# Patient Record
Sex: Female | Born: 1965 | Race: Black or African American | Hispanic: No | Marital: Married | Smoking: Never smoker
Health system: Southern US, Community
[De-identification: ages and names within clinical notes are randomized; demographics above are authoritative.]

## PROBLEM LIST (undated history)

## (undated) DIAGNOSIS — Z87442 Personal history of urinary calculi: Secondary | ICD-10-CM

## (undated) DIAGNOSIS — E785 Hyperlipidemia, unspecified: Secondary | ICD-10-CM

## (undated) DIAGNOSIS — E119 Type 2 diabetes mellitus without complications: Secondary | ICD-10-CM

## (undated) DIAGNOSIS — R519 Headache, unspecified: Secondary | ICD-10-CM

## (undated) DIAGNOSIS — D649 Anemia, unspecified: Secondary | ICD-10-CM

## (undated) DIAGNOSIS — I10 Essential (primary) hypertension: Secondary | ICD-10-CM

## (undated) DIAGNOSIS — N200 Calculus of kidney: Secondary | ICD-10-CM

## (undated) HISTORY — DX: Calculus of kidney: N20.0

## (undated) HISTORY — DX: Hyperlipidemia, unspecified: E78.5

## (undated) HISTORY — DX: Type 2 diabetes mellitus without complications: E11.9

## (undated) HISTORY — DX: Essential (primary) hypertension: I10

## (undated) HISTORY — PX: TONSILLECTOMY: SUR1361

---

## 2016-07-28 HISTORY — PX: ABDOMINAL HYSTERECTOMY: SHX81

## 2017-04-27 DIAGNOSIS — N200 Calculus of kidney: Secondary | ICD-10-CM

## 2017-04-27 HISTORY — DX: Calculus of kidney: N20.0

## 2017-05-22 ENCOUNTER — Other Ambulatory Visit: Payer: Self-pay | Admitting: Internal Medicine

## 2017-05-22 ENCOUNTER — Encounter: Payer: Self-pay | Admitting: Internal Medicine

## 2017-05-22 ENCOUNTER — Ambulatory Visit (INDEPENDENT_AMBULATORY_CARE_PROVIDER_SITE_OTHER): Payer: PRIVATE HEALTH INSURANCE | Admitting: Internal Medicine

## 2017-05-22 VITALS — BP 142/88 | HR 78 | Resp 16 | Ht 61.0 in | Wt 153.0 lb

## 2017-05-22 DIAGNOSIS — I10 Essential (primary) hypertension: Secondary | ICD-10-CM | POA: Insufficient documentation

## 2017-05-22 DIAGNOSIS — M778 Other enthesopathies, not elsewhere classified: Secondary | ICD-10-CM

## 2017-05-22 DIAGNOSIS — E118 Type 2 diabetes mellitus with unspecified complications: Secondary | ICD-10-CM | POA: Insufficient documentation

## 2017-05-22 DIAGNOSIS — E782 Mixed hyperlipidemia: Secondary | ICD-10-CM

## 2017-05-22 DIAGNOSIS — E119 Type 2 diabetes mellitus without complications: Secondary | ICD-10-CM

## 2017-05-22 DIAGNOSIS — N200 Calculus of kidney: Secondary | ICD-10-CM | POA: Diagnosis not present

## 2017-05-22 DIAGNOSIS — E785 Hyperlipidemia, unspecified: Secondary | ICD-10-CM | POA: Insufficient documentation

## 2017-05-22 DIAGNOSIS — E1169 Type 2 diabetes mellitus with other specified complication: Secondary | ICD-10-CM | POA: Insufficient documentation

## 2017-05-22 DIAGNOSIS — Z1239 Encounter for other screening for malignant neoplasm of breast: Secondary | ICD-10-CM

## 2017-05-22 DIAGNOSIS — Z1231 Encounter for screening mammogram for malignant neoplasm of breast: Secondary | ICD-10-CM

## 2017-05-22 LAB — POCT URINALYSIS DIPSTICK
Bilirubin, UA: NEGATIVE
Glucose, UA: NEGATIVE
KETONES UA: NEGATIVE
LEUKOCYTES UA: NEGATIVE
NITRITE UA: NEGATIVE
PH UA: 6 (ref 5.0–8.0)
PROTEIN UA: NEGATIVE
RBC UA: NEGATIVE
Spec Grav, UA: <=1.005 — AB (ref 1.010–1.025)
Urobilinogen, UA: 0.2 E.U./dL

## 2017-05-22 MED ORDER — LISINOPRIL 5 MG PO TABS: 5.0000 mg | ORAL_TABLET | Freq: Every day | ORAL | 3 refills | Status: DC

## 2017-05-22 MED ORDER — SIMVASTATIN 10 MG PO TABS: 10.0000 mg | ORAL_TABLET | Freq: Every day | ORAL | 3 refills | Status: DC

## 2017-05-22 MED ORDER — GLUCOSE BLOOD VI STRP: ORAL_STRIP | 12 refills | Status: DC

## 2017-05-22 NOTE — Patient Instructions (Addendum)
Ibuprofen 400 mg three times a day and Ice pack for 15 minutes at the end of the day  For DM - I will likely add Jardiance -  Samples of Synjardy is Jardiance plus metformin.  You would substitute one synjardy for your morning metformin dose as a trial.  Schedule Mammogram - call the number on the card  S

## 2017-05-22 NOTE — Progress Notes (Signed)
Date:  05/22/2017   Name:  Teresa Rivas   DOB:  12/19/1965   MRN:  [id number]   Chief Complaint: Establish Care ([place]Juel Aurora there. ); Diabetes (Nausea from BS over 200 for last 1-2 weeks. A1C November 2017 was 10.6 and she was increased to 1000 bid of Metformin and that is last changes done. Eating more than she did before moving here to [place]  2 weeks ago. ); Elbow Injury (Right arm- Pain when using her arm to push or pull or squeeze. Denies injury. Was on and off last few months worsening over past 2 weeks. ); and Nephrolithiasis ([place] here  2 wks ago and was told she has multiple kidney stones and she passed one but needs REF to Urology )  Diabetes  She presents for her follow-up diabetic visit. She has type 2 diabetes mellitus. Her disease course has been worsening (last A1C very high). Pertinent negatives for hypoglycemia include no headaches or tremors. Pertinent negatives for diabetes include no chest pain, no fatigue, no foot paresthesias, no polydipsia, no polyuria and no weight loss. Symptoms are stable. Risk factors for coronary artery disease include dyslipidemia and hypertension. Current diabetic treatment includes oral agent (monotherapy) (metformin 1000 mg bid). Her weight is stable. She is following a generally unhealthy diet. An ACE inhibitor/angiotensin II receptor blocker is being taken. Eye exam is current (reported by patient).  Arm Pain   The incident occurred more than 1 week ago. There was no injury mechanism. The pain is present in the right elbow. The quality of the pain is described as burning. The pain is mild. Pertinent negatives include no chest pain or numbness. The symptoms are aggravated by movement and lifting. She has tried NSAIDs for the symptoms. The treatment provided mild relief.  Renal stones - new onset one month ago before moving here.  Seen in ER and had stones on right  side.  She passed a stone and has had no more pain.  She was supposed to be referred to a Urologist.   Review of Systems  Constitutional: Negative for appetite change, fatigue, fever, unexpected weight change and weight loss.  HENT: Negative for tinnitus and trouble swallowing.   Eyes: Negative for visual disturbance.  Respiratory: Negative for cough, chest tightness and shortness of breath.   Cardiovascular: Negative for chest pain, palpitations and leg swelling.  Gastrointestinal: Negative for abdominal pain.  Endocrine: Negative for polydipsia and polyuria.  Genitourinary: Negative for dysuria and hematuria.  Musculoskeletal: Positive for arthralgias. Negative for gait problem. Neck pain: right elbow.  Skin: Negative for rash.  Neurological: Negative for tremors, numbness and headaches.  Hematological: Negative for adenopathy.  Psychiatric/Behavioral: Negative for dysphoric mood and sleep disturbance.    Patient Active Problem List   Diagnosis Date Noted  . Type 2 diabetes mellitus without complication, without long-term current use of insulin (HCC) 05/22/2017  . Mixed hyperlipidemia 05/22/2017  . Essential hypertension 05/22/2017  . Renal stones 05/22/2017    Prior to Admission medications   Medication Sig Start Date End Date Taking? Authorizing Provider  lisinopril (PRINIVIL,ZESTRIL) 5 MG tablet Take 5 mg by mouth daily.   Yes [provider]  metFORMIN (GLUCOPHAGE) 1000 MG tablet Take 1,000 mg by mouth 2 (two) times daily with a meal.   Yes [provider]  simvastatin (ZOCOR) 10 MG tablet Take 10 mg by mouth daily.   Yes [provider]    Allergies  Allergen Reactions  . Codeine     Past Surgical History:  Procedure Laterality Date  . ABDOMINAL HYSTERECTOMY  07/28/2016   total due to fibroids  . TONSILLECTOMY      Social History  Substance Use Topics  . Smoking status: Never Smoker  . Smokeless tobacco: Never Used  . Alcohol use No       Medication list has been reviewed and updated.   Physical Exam  Constitutional: She is oriented to person, place, and time. She appears well-developed. No distress.  HENT:  Head: Normocephalic and atraumatic.  Neck: Normal range of motion. Neck supple. Carotid bruit is not present. No thyromegaly present.  Cardiovascular: Normal rate, regular rhythm and normal heart sounds.   Pulmonary/Chest: Effort normal and breath sounds normal. No respiratory distress. She has no wheezes.  Abdominal: There is no CVA tenderness.  Musculoskeletal: Normal range of motion.       Right elbow: She exhibits no swelling. Tenderness found. Lateral epicondyle tenderness noted.  Neurological: She is alert and oriented to person, place, and time. She has normal reflexes. No sensory deficit.  Skin: Skin is warm and dry. No rash noted.  Psychiatric: She has a normal mood and affect. Her speech is normal and behavior is normal. Thought content normal.  Nursing note and vitals reviewed.   BP (!) 142/88   Pulse 78   Resp 16   Ht 5\' 1"  (1.549 m)   Wt 153 lb (69.4 kg)   LMP  (LMP Unknown)   SpO2 100%   BMI 28.91 kg/m   Assessment and Plan: 1. Type 2 diabetes mellitus without complication, without long-term current use of insulin (HCC) Will likely add Jardiance - sample of synjardy 25/1000 given to sub for AM metformin - Comprehensive metabolic panel - Hemoglobin A1c - TSH - Microalbumin / creatinine urine ratio - glucose blood (ACCU-CHEK AVIVA) test strip; Use as instructed  Dispense: 50 each; Refill: 12  2. Mixed hyperlipidemia Continue statin therapy - Lipid panel - simvastatin (ZOCOR) 10 MG tablet; Take 1 tablet (10 mg total) by mouth daily.  Dispense: 60 tablet; Refill: 3  3. Essential hypertension Resume ACE - CBC with Differential/Platelet - lisinopril (PRINIVIL,ZESTRIL) 5 MG tablet; Take 1 tablet (5 mg total) by mouth daily.  Dispense: 60 tablet; Refill: 3  4. Renal stones refer -  POCT urinalysis dipstick  5. Tendonitis of elbow, right Use Advil and ice  6. Breast cancer screening - MM DIGITAL SCREENING BILATERAL; Future   Meds ordered this encounter  Medications  . glucose blood (ACCU-CHEK AVIVA) test strip    Sig: Use as instructed    Dispense:  50 each    Refill:  12  . lisinopril (PRINIVIL,ZESTRIL) 5 MG tablet    Sig: Take 1 tablet (5 mg total) by mouth daily.    Dispense:  60 tablet    Refill:  3  . simvastatin (ZOCOR) 10 MG tablet    Sig: Take 1 tablet (10 mg total) by mouth daily.    Dispense:  60 tablet    Refill:  3    Bari Edward, MD [facility]  05/22/2017

## 2017-05-23 LAB — CBC WITH DIFFERENTIAL/PLATELET
Basophils Absolute: 0 10*3/uL (ref 0.0–0.2)
Basos: 0 %
EOS (ABSOLUTE): 0.1 10*3/uL (ref 0.0–0.4)
EOS: 1 %
Hematocrit: 39.7 % (ref 34.0–46.6)
Hemoglobin: 13.1 g/dL (ref 11.1–15.9)
IMMATURE GRANS (ABS): 0 10*3/uL (ref 0.0–0.1)
IMMATURE GRANULOCYTES: 0 %
LYMPHS: 36 %
Lymphocytes Absolute: 2.9 10*3/uL (ref 0.7–3.1)
MCH: 26.3 pg — ABNORMAL LOW (ref 26.6–33.0)
MCHC: 33 g/dL (ref 31.5–35.7)
MCV: 80 fL (ref 79–97)
MONOS ABS: 0.4 10*3/uL (ref 0.1–0.9)
Monocytes: 5 %
NEUTROS PCT: 58 %
Neutrophils Absolute: 4.8 10*3/uL (ref 1.4–7.0)
PLATELETS: 271 10*3/uL (ref 150–379)
RBC: 4.98 x10E6/uL (ref 3.77–5.28)
RDW: 13.6 % (ref 12.3–15.4)
WBC: 8.3 10*3/uL (ref 3.4–10.8)

## 2017-05-23 LAB — COMPREHENSIVE METABOLIC PANEL
ALT: 19 IU/L (ref 0–32)
AST: 17 IU/L (ref 0–40)
Albumin/Globulin Ratio: 1.9 (ref 1.2–2.2)
Albumin: 4.8 g/dL (ref 3.5–5.5)
Alkaline Phosphatase: 114 IU/L (ref 39–117)
BUN/Creatinine Ratio: 16 (ref 9–23)
BUN: 13 mg/dL (ref 6–24)
Bilirubin Total: 0.3 mg/dL (ref 0.0–1.2)
CALCIUM: 10.8 mg/dL — AB (ref 8.7–10.2)
CO2: 24 mmol/L (ref 20–29)
Chloride: 97 mmol/L (ref 96–106)
Creatinine, Ser: 0.81 mg/dL (ref 0.57–1.00)
GFR calc Af Amer: 97 mL/min/{1.73_m2} (ref >59)
GFR, EST NON AFRICAN AMERICAN: 84 mL/min/{1.73_m2} (ref >59)
Globulin, Total: 2.5 g/dL (ref 1.5–4.5)
Glucose: 192 mg/dL — ABNORMAL HIGH (ref 65–99)
Potassium: 4.4 mmol/L (ref 3.5–5.2)
Sodium: 137 mmol/L (ref 134–144)
Total Protein: 7.3 g/dL (ref 6.0–8.5)

## 2017-05-23 LAB — LIPID PANEL
CHOLESTEROL TOTAL: 161 mg/dL (ref 100–199)
Chol/HDL Ratio: 2.6 ratio (ref 0.0–4.4)
HDL: 61 mg/dL (ref >39)
LDL Calculated: 88 mg/dL (ref 0–99)
TRIGLYCERIDES: 60 mg/dL (ref 0–149)
VLDL CHOLESTEROL CAL: 12 mg/dL (ref 5–40)

## 2017-05-23 LAB — TSH: TSH: 2.12 u[IU]/mL (ref 0.450–4.500)

## 2017-05-23 LAB — HEMOGLOBIN A1C
ESTIMATED AVERAGE GLUCOSE: 263 mg/dL
Hgb A1c MFr Bld: 10.8 % — ABNORMAL HIGH (ref 4.8–5.6)

## 2017-05-23 LAB — MICROALBUMIN / CREATININE URINE RATIO
Creatinine, Urine: 13.2 mg/dL
Microalbumin, Urine: <3 ug/mL

## 2017-06-05 ENCOUNTER — Encounter: Payer: Self-pay | Admitting: Internal Medicine

## 2017-06-05 NOTE — Telephone Encounter (Signed)
Please advice- patient mychart msg.

## 2017-06-14 ENCOUNTER — Other Ambulatory Visit: Payer: Self-pay | Admitting: Internal Medicine

## 2017-06-14 ENCOUNTER — Encounter: Payer: Self-pay | Admitting: Internal Medicine

## 2017-06-14 MED ORDER — METFORMIN HCL 1000 MG PO TABS: 1000.0000 mg | ORAL_TABLET | Freq: Every evening | ORAL | 5 refills | Status: DC

## 2017-06-14 MED ORDER — EMPAGLIFLOZIN-METFORMIN HCL ER 25-1000 MG PO TB24: 1.0000 | ORAL_TABLET | Freq: Every morning | ORAL | 5 refills | Status: DC

## 2017-06-14 NOTE — Telephone Encounter (Signed)
Meds

## 2017-06-18 NOTE — Telephone Encounter (Signed)
Pt cannot afford medication. Wants to stick to metformin. Please Advise.

## 2017-06-25 ENCOUNTER — Encounter: Payer: Self-pay | Admitting: Internal Medicine

## 2017-06-25 ENCOUNTER — Ambulatory Visit
Admission: RE | Admit: 2017-06-25 | Discharge: 2017-06-25 | Disposition: A | Discharge status: Home / Self Care | Payer: No Typology Code available for payment source | Source: Ambulatory Visit | Attending: Internal Medicine | Admitting: Internal Medicine

## 2017-06-25 DIAGNOSIS — Z1231 Encounter for screening mammogram for malignant neoplasm of breast: Secondary | ICD-10-CM | POA: Insufficient documentation

## 2017-06-25 DIAGNOSIS — Z1239 Encounter for other screening for malignant neoplasm of breast: Secondary | ICD-10-CM

## 2017-06-26 ENCOUNTER — Other Ambulatory Visit: Payer: Self-pay

## 2017-06-26 DIAGNOSIS — M778 Other enthesopathies, not elsewhere classified: Secondary | ICD-10-CM

## 2017-07-05 ENCOUNTER — Inpatient Hospital Stay
Admission: RE | Admit: 2017-07-05 | Discharge: 2017-07-05 | Disposition: A | Discharge status: Home / Self Care | Payer: Self-pay | Source: Ambulatory Visit | Attending: *Deleted | Admitting: *Deleted

## 2017-07-05 ENCOUNTER — Other Ambulatory Visit: Payer: Self-pay | Admitting: *Deleted

## 2017-07-05 DIAGNOSIS — Z9289 Personal history of other medical treatment: Secondary | ICD-10-CM

## 2017-09-26 ENCOUNTER — Ambulatory Visit (INDEPENDENT_AMBULATORY_CARE_PROVIDER_SITE_OTHER): Payer: PRIVATE HEALTH INSURANCE | Admitting: Internal Medicine

## 2017-09-26 ENCOUNTER — Encounter: Payer: Self-pay | Admitting: Internal Medicine

## 2017-09-26 VITALS — BP 136/80 | HR 74 | Ht 61.0 in | Wt 151.0 lb

## 2017-09-26 DIAGNOSIS — E782 Mixed hyperlipidemia: Secondary | ICD-10-CM

## 2017-09-26 DIAGNOSIS — I10 Essential (primary) hypertension: Secondary | ICD-10-CM

## 2017-09-26 DIAGNOSIS — E119 Type 2 diabetes mellitus without complications: Secondary | ICD-10-CM

## 2017-09-26 DIAGNOSIS — M7711 Lateral epicondylitis, right elbow: Secondary | ICD-10-CM

## 2017-09-26 MED ORDER — IBUPROFEN 800 MG PO TABS: 800.0000 mg | ORAL_TABLET | Freq: Three times a day (TID) | ORAL | 0 refills | Status: DC | PRN

## 2017-09-26 MED ORDER — GLIMEPIRIDE 2 MG PO TABS: 2.0000 mg | ORAL_TABLET | Freq: Every day | ORAL | 3 refills | Status: DC

## 2017-09-26 NOTE — Progress Notes (Signed)
Date:  09/26/2017   Name:  Teresa ShockShanti Rivas   DOB:  [birth year]   MRN:  [id number]   Chief Complaint: Diabetes (Still having issues with BS being elevated. ); Hyperlipidemia; and Hypertension She was unable to afford synjardy - the cost was $150 per month so she went back to taking metformin bid.  She does not check her BS very often.  Diabetes  She presents for her follow-up diabetic visit. She has type 2 diabetes mellitus. Her disease course has been improving. Associated symptoms include polyuria. Pertinent negatives for diabetes include no chest pain, no fatigue and no polydipsia. Symptoms are stable (could not afford jardiance). Current diabetic treatment includes oral agent (monotherapy). She is compliant with treatment all of the time. There is no change in her home blood glucose trend. An ACE inhibitor/angiotensin II receptor blocker is being taken. Eye exam is current.  Hyperlipidemia  This is a chronic problem. Pertinent negatives include no chest pain or shortness of breath. Current antihyperlipidemic treatment includes statins. The current treatment provides significant improvement of lipids.  Hypertension  This is a chronic problem. The problem is controlled. Pertinent negatives include no chest pain, palpitations or shortness of breath. Past treatments include ACE inhibitors. The current treatment provides significant improvement.   Lab Results  Component Value Date   HGBA1C 10.8 (H) 05/22/2017   Lab Results  Component Value Date   CREATININE 0.81 05/22/2017   BUN 13 05/22/2017   NA 137 05/22/2017   K 4.4 05/22/2017   CL 97 05/22/2017   CO2 24 05/22/2017   Lab Results  Component Value Date   CHOL 161 05/22/2017   HDL 61 05/22/2017   LDLCALC 88 05/22/2017   TRIG 60 05/22/2017   CHOLHDL 2.6 05/22/2017      Review of Systems  Constitutional: Negative for chills, fatigue, fever and unexpected weight change.  Eyes: Negative for visual disturbance.  Respiratory:  Negative for chest tightness, shortness of breath and wheezing.   Cardiovascular: Negative for chest pain and palpitations.  Gastrointestinal: Negative for abdominal pain.  Endocrine: Positive for polyuria. Negative for polydipsia.  Musculoskeletal: Positive for arthralgias (right elbow is hurting again).  Skin: Negative for rash.  Allergic/Immunologic: Negative for environmental allergies.  Psychiatric/Behavioral: Negative for sleep disturbance.    Patient Active Problem List   Diagnosis Date Noted  . 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 Type 2 diabetes mellitus without complication, without long-term current use of insulin (HCC) 05/22/2017  . Mixed hyperlipidemia 05/22/2017  . Essential hypertension 05/22/2017  . Renal stones 05/22/2017    Prior to Admission medications   Medication Sig Start Date End Date Taking? Authorizing Provider       Reubin MilanBerglund, Jevante Hollibaugh H, MD  glucose blood (ACCU-CHEK AVIVA) test strip Use as instructed 05/22/17   Reubin MilanBerglund, Cincere Deprey H, MD  lisinopril (PRINIVIL,ZESTRIL) 5 MG tablet Take 1 tablet (5 mg total) by mouth daily. 05/22/17   Reubin MilanBerglund, Shamir Tuzzolino H, MD  metFORMIN (GLUCOPHAGE) 1000 MG tablet Take 1 tablet (1,000 mg total) by mouth every evening. 06/14/17   Reubin MilanBerglund, Deejay Koppelman H, MD  simvastatin (ZOCOR) 10 MG tablet Take 1 tablet (10 mg total) by mouth daily. 05/22/17   Reubin MilanBerglund, Joyel Chenette H, MD    Allergies  Allergen Reactions  . Codeine     Past Surgical History:  Procedure Laterality Date  . ABDOMINAL HYSTERECTOMY  07/28/2016   total due to fibroids  . TONSILLECTOMY      Social History  Substance Use Topics  . Smoking  status: Never Smoker  . Smokeless tobacco: Never Used  . Alcohol use No     Medication list has been reviewed and updated.  PHQ 2/9 Scores 09/26/2017  PHQ - 2 Score 0    Physical Exam  Constitutional: She is oriented to person, place, and time. She appears well-developed. No distress.  HENT:  Head: Normocephalic and atraumatic.    Neck: Normal range of motion. Neck supple.  Cardiovascular: Normal rate, regular rhythm and normal heart sounds.   Pulmonary/Chest: Effort normal and breath sounds normal. No respiratory distress. She has no wheezes.  Musculoskeletal: Normal range of motion. She exhibits tenderness. She exhibits no edema.       Right elbow: She exhibits no swelling and no effusion. Tenderness found. Lateral epicondyle tenderness noted.  Neurological: She is alert and oriented to person, place, and time.  Skin: Skin is warm and dry. No rash noted.  Psychiatric: She has a normal mood and affect. Her behavior is normal. Thought content normal.  Nursing note and vitals reviewed.   BP 136/80   Pulse 74   Ht 5\' 1"  (1.549 m)   Wt 151 lb (68.5 kg)   LMP  (LMP Unknown)   SpO2 100%   BMI 28.53 kg/m   Assessment and Plan: 1. Type 2 diabetes mellitus without complication, without long-term current use of insulin (HCC) Continue metformin; add amaryl - glimepiride (AMARYL) 2 MG tablet; Take 1 tablet (2 mg total) by mouth daily before breakfast.  Dispense: 30 tablet; Refill: 3 - Hemoglobin A1c  2. Essential hypertension Continue current medications  3. Mixed hyperlipidemia On statin therapy  4. Epicondylitis, lateral, right Continue rubs, massage and ibuprofen Return to Ortho if needed - ibuprofen (ADVIL,MOTRIN) 800 MG tablet; Take 1 tablet (800 mg total) by mouth every 8 (eight) hours as needed.  Dispense: 90 tablet; Refill: 0   Meds ordered this encounter  Medications  . glimepiride (AMARYL) 2 MG tablet    Sig: Take 1 tablet (2 mg total) by mouth daily before breakfast.    Dispense:  30 tablet    Refill:  3  . ibuprofen (ADVIL,MOTRIN) 800 MG tablet    Sig: Take 1 tablet (800 mg total) by mouth every 8 (eight) hours as needed.    Dispense:  90 tablet    Refill:  0    Partially dictated using Animal nutritionist. Any errors are unintentional.  Bari Edward, MD [facility]  09/26/2017

## 2017-10-17 NOTE — Progress Notes (Signed)
Called patient and left VMs- unable to reach pt but informed needs to get labs done.

## 2017-12-18 ENCOUNTER — Other Ambulatory Visit: Payer: Self-pay | Admitting: Internal Medicine

## 2018-01-25 ENCOUNTER — Ambulatory Visit: Payer: PRIVATE HEALTH INSURANCE | Admitting: Internal Medicine

## 2018-02-18 ENCOUNTER — Ambulatory Visit (INDEPENDENT_AMBULATORY_CARE_PROVIDER_SITE_OTHER): Payer: BLUE CROSS/BLUE SHIELD | Admitting: Podiatry

## 2018-02-18 ENCOUNTER — Encounter: Payer: Self-pay | Admitting: Podiatry

## 2018-02-18 VITALS — BP 145/80 | HR 78

## 2018-02-18 DIAGNOSIS — M2010 Hallux valgus (acquired), unspecified foot: Secondary | ICD-10-CM

## 2018-02-18 DIAGNOSIS — L84 Corns and callosities: Secondary | ICD-10-CM

## 2018-02-18 DIAGNOSIS — E119 Type 2 diabetes mellitus without complications: Secondary | ICD-10-CM | POA: Diagnosis not present

## 2018-02-18 DIAGNOSIS — M2142 Flat foot [pes planus] (acquired), left foot: Secondary | ICD-10-CM

## 2018-02-18 DIAGNOSIS — M2141 Flat foot [pes planus] (acquired), right foot: Secondary | ICD-10-CM | POA: Diagnosis not present

## 2018-02-18 NOTE — Progress Notes (Signed)
This patient presents the office with chief complaint of painful callus on the  bottom of both feet at the heel area.  She says she has soaked her feet in an effort to remove the callus and she experiences pain when she is been walking.  There is a dramatic  line of demarcation between callus tissue on both feet and normal skin.  There is no exubernace of callus noted.  She presents to the office for evaluation of feet.  Patient is diabetic taking medicine. She presents the office for an evaluation and treatment  General Appearance  Alert, conversant and in no acute stress.  Vascular  Dorsalis pedis and posterior tibial  pulses are palpable  bilaterally.  Capillary return is within normal limits  bilaterally. Temperature is within normal limits  bilaterally.  Neurologic  Senn-Weinstein monofilament wire test within normal limits  bilaterally. Muscle power within normal limits bilaterally.  Nails No evidence of fungal or bacterial infection in nails.  Orthopedic  No limitations of motion of motion feet .  No crepitus or effusions noted.  HAV  B/L.  Pes planus noted  B/L.  Skin  normotropic skin with no porokeratosis noted bilaterally.  No signs of infections or ulcers noted.  Plantar heel callus bilaterally with the right greater than the left.  Plantar heel callus  B/L   IE.  Explained to the patient that the callus that she has on her heels is for protection when she walks.  The reason it has become painful is that she is removing the callus emoving the callus and walking on new skin.  I told her that she should discontinue soaking and removing her callus.  This diabetic patient was evaluated for diabetic foot exam.  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  I suggested she purchased Spenco three-quarter orthotics insoles to help cushion her foot  as well as support her arch. RTC prn.  Suggested keratolytic emolient for future use.   Helane GuntherGregory Chau Sawin DPM

## 2018-03-22 ENCOUNTER — Other Ambulatory Visit: Payer: Self-pay | Admitting: Internal Medicine

## 2018-03-22 DIAGNOSIS — I10 Essential (primary) hypertension: Secondary | ICD-10-CM

## 2018-03-22 NOTE — Telephone Encounter (Signed)
Left a message

## 2018-03-28 ENCOUNTER — Other Ambulatory Visit: Payer: Self-pay | Admitting: Internal Medicine

## 2018-03-28 DIAGNOSIS — E119 Type 2 diabetes mellitus without complications: Secondary | ICD-10-CM

## 2018-04-20 ENCOUNTER — Other Ambulatory Visit: Payer: Self-pay | Admitting: Internal Medicine

## 2018-04-20 DIAGNOSIS — I10 Essential (primary) hypertension: Secondary | ICD-10-CM

## 2018-04-28 ENCOUNTER — Other Ambulatory Visit: Payer: Self-pay | Admitting: Internal Medicine

## 2018-04-28 DIAGNOSIS — E782 Mixed hyperlipidemia: Secondary | ICD-10-CM

## 2018-05-23 ENCOUNTER — Other Ambulatory Visit: Payer: Self-pay | Admitting: Internal Medicine

## 2018-06-07 ENCOUNTER — Other Ambulatory Visit: Payer: Self-pay

## 2018-06-07 ENCOUNTER — Ambulatory Visit
Admission: EM | Admit: 2018-06-07 | Discharge: 2018-06-07 | Disposition: A | Discharge status: Home / Self Care | Payer: No Typology Code available for payment source | Attending: Family Medicine | Admitting: Family Medicine

## 2018-06-07 ENCOUNTER — Encounter: Payer: Self-pay | Admitting: Emergency Medicine

## 2018-06-07 DIAGNOSIS — Z885 Allergy status to narcotic agent status: Secondary | ICD-10-CM | POA: Insufficient documentation

## 2018-06-07 DIAGNOSIS — Z7984 Long term (current) use of oral hypoglycemic drugs: Secondary | ICD-10-CM | POA: Insufficient documentation

## 2018-06-07 DIAGNOSIS — Z833 Family history of diabetes mellitus: Secondary | ICD-10-CM | POA: Insufficient documentation

## 2018-06-07 DIAGNOSIS — Z9889 Other specified postprocedural states: Secondary | ICD-10-CM | POA: Insufficient documentation

## 2018-06-07 DIAGNOSIS — Z803 Family history of malignant neoplasm of breast: Secondary | ICD-10-CM | POA: Insufficient documentation

## 2018-06-07 DIAGNOSIS — Z791 Long term (current) use of non-steroidal anti-inflammatories (NSAID): Secondary | ICD-10-CM | POA: Insufficient documentation

## 2018-06-07 DIAGNOSIS — Z90710 Acquired absence of both cervix and uterus: Secondary | ICD-10-CM | POA: Insufficient documentation

## 2018-06-07 DIAGNOSIS — E1165 Type 2 diabetes mellitus with hyperglycemia: Secondary | ICD-10-CM | POA: Insufficient documentation

## 2018-06-07 DIAGNOSIS — I10 Essential (primary) hypertension: Secondary | ICD-10-CM | POA: Insufficient documentation

## 2018-06-07 DIAGNOSIS — R739 Hyperglycemia, unspecified: Secondary | ICD-10-CM

## 2018-06-07 DIAGNOSIS — Z79899 Other long term (current) drug therapy: Secondary | ICD-10-CM | POA: Insufficient documentation

## 2018-06-07 DIAGNOSIS — Z8249 Family history of ischemic heart disease and other diseases of the circulatory system: Secondary | ICD-10-CM | POA: Insufficient documentation

## 2018-06-07 DIAGNOSIS — Z87442 Personal history of urinary calculi: Secondary | ICD-10-CM | POA: Insufficient documentation

## 2018-06-07 DIAGNOSIS — E782 Mixed hyperlipidemia: Secondary | ICD-10-CM | POA: Insufficient documentation

## 2018-06-07 LAB — GLUCOSE, CAPILLARY: Glucose-Capillary: 492 mg/dL — ABNORMAL HIGH (ref 70–99)

## 2018-06-07 NOTE — ED Triage Notes (Signed)
Pt here today c/o high blood sugar. It was 582. She called her PCP and advised to go to ED or urgent care.

## 2018-06-07 NOTE — ED Provider Notes (Signed)
[facility]    CSN: [id number] Arrival date & time: 06/07/18  1929     History   Chief Complaint Chief Complaint  Patient presents with  . Hyperglycemia    HPI Teresa Rivas is a 52 y.o. female.   52 yo female with diabetes type 2 not requiring insulin presents with a c/o "blood sugars running high for the past several months, but higher today". States today it was 582 at home. Patient denies any symptoms. Denies chest pains, shortness of breath, vomiting, diarrhea. States has been drinking fluids. Takes glimepiride 2mg  daily and metformin 1000mg  once daily. Takes metformin once daily in the evening, but has not taken today's dose.   The history is provided by the patient.  Hyperglycemia    Past Medical History:  Diagnosis Date  . Diabetes mellitus without complication (HCC)   . Hyperlipidemia   . Hypertension   . Kidney stones 04/27/2017    Patient Active Problem List   Diagnosis Date Noted  . 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 Type 2 diabetes mellitus without complication, without long-term current use of insulin (HCC) 05/22/2017  . Mixed hyperlipidemia 05/22/2017  . Essential hypertension 05/22/2017  . Renal stones 05/22/2017    Past Surgical History:  Procedure Laterality Date  . ABDOMINAL HYSTERECTOMY  07/28/2016   total due to fibroids  . TONSILLECTOMY      OB History   None      Home Medications    Prior to Admission medications   Medication Sig Start Date End Date Taking? Authorizing Provider  glimepiride (AMARYL) 2 MG tablet Take 1 tablet (2 mg total) by mouth daily before breakfast. 09/26/17  Yes Reubin MilanBerglund, Laura H, MD  glucose blood (ACCU-CHEK AVIVA) test strip Use as instructed 05/22/17  Yes Reubin MilanBerglund, Laura H, MD  ibuprofen (ADVIL,MOTRIN) 800 MG tablet Take 1 tablet (800 mg total) by mouth every 8 (eight) hours as needed. 09/26/17  Yes Reubin MilanBerglund, Laura H, MD  lisinopril (PRINIVIL,ZESTRIL) 5 MG tablet TAKE 1 TABLET BY MOUTH  EVERY DAY 03/22/18  Yes Reubin MilanBerglund, Laura H, MD  metFORMIN (GLUCOPHAGE) 1000 MG tablet TAKE 1 TABLET (1,000 MG TOTAL) BY MOUTH EVERY EVENING. 12/18/17  Yes Reubin MilanBerglund, Laura H, MD  simvastatin (ZOCOR) 10 MG tablet Take 1 tablet (10 mg total) by mouth daily. 05/22/17  Yes Reubin MilanBerglund, Laura H, MD  lisinopril (PRINIVIL,ZESTRIL) 5 MG tablet TAKE 1 TABLET BY MOUTH EVERY DAY 04/20/18   Reubin MilanBerglund, Laura H, MD    Family History Family History  Problem Relation Age of Onset  . Diabetes Mother   . Hypertension Mother   . Breast cancer Mother [id number]  . Breast cancer Maternal Grandmother   . Breast cancer Cousin        pat cousin    Social History Social History   Tobacco Use  . Smoking status: Never Smoker  . Smokeless tobacco: Never Used  Substance Use Topics  . Alcohol use: No  . Drug use: No     Allergies   Codeine   Review of Systems Review of Systems   Physical Exam Triage Vital Signs ED Triage Vitals  Enc Vitals Group     BP 06/07/18 1939 (!) 158/74     Pulse Rate 06/07/18 1939 87     Resp 06/07/18 1939 16     Temp 06/07/18 1939 98.6 F (37 C)     Temp Source 06/07/18 1939 Oral     SpO2 06/07/18 1939 99 %     Weight 06/07/18  1940 133 lb (60.3 kg)     Height 06/07/18 1940 5\' 1"  (1.549 m)     Head Circumference --      Peak Flow --      Pain Score 06/07/18 1940 0     Pain Loc --      Pain Edu? --      Excl. in GC? --    No data found.  Updated Vital Signs BP (!) 158/74 (BP Location: Left Arm)   Pulse 87   Temp 98.6 F (37 C) (Oral)   Resp 16   Ht 5\' 1"  (1.549 m)   Wt 133 lb (60.3 kg)   LMP  (LMP Unknown)   SpO2 99%   BMI 25.13 kg/m   Visual Acuity Right Eye Distance:   Left Eye Distance:   Bilateral Distance:    Right Eye Near:   Left Eye Near:    Bilateral Near:     Physical Exam  Constitutional: She is oriented to person, place, and time. She appears well-developed and well-nourished. No distress.  Cardiovascular: Normal rate, regular rhythm and normal  heart sounds.  Pulmonary/Chest: Effort normal and breath sounds normal. No stridor. No respiratory distress. She has no wheezes. She has no rales.  Neurological: She is alert and oriented to person, place, and time.  Skin: She is not diaphoretic.  Psychiatric: Her behavior is normal. Judgment and thought content normal.  Vitals reviewed.    UC Treatments / Results  Labs (all labs ordered are listed, but only abnormal results are displayed) Labs Reviewed  GLUCOSE, CAPILLARY - Abnormal; Notable for the following components:      Result Value   Glucose-Capillary 492 (*)    All other components within normal limits  CBG MONITORING, ED    EKG None  Radiology No results found.  Procedures Procedures (including critical care time)  Medications Ordered in UC Medications - No data to display  Initial Impression / Assessment and Plan / UC Course  I have reviewed the triage vital signs and the nursing notes.  Pertinent labs & imaging results that were available during my care of the patient were reviewed by me and considered in my medical decision making (see chart for details).      Final Clinical Impressions(s) / UC Diagnoses   Final diagnoses:  Hyperglycemia     Discharge Instructions     Increase glimepiride to 4mg  daily Increase metformin to twice daily Follow up with primary care on Monday    ED Prescriptions    None     1. diagnosis reviewed with patient 2. Explained to patient that we do not have insulin at the urgent care; patient would need to go to the emergency department if blood sugars continue this high over the weekend or worsen 3. Recommend increasing glimepiride to 4mg  starting tomorrow am; take tonight's metformin dose then tomorrow increase to twice daily   4. Follow-up with PCP on Monday   Controlled Substance Prescriptions [place] Controlled Substance Registry consulted? Not Applicable   Payton Mccallum, MD 06/07/18 2016

## 2018-06-07 NOTE — Discharge Instructions (Signed)
Increase glimepiride to 4mg  daily Increase metformin to twice daily Follow up with primary care on Monday

## 2018-06-10 ENCOUNTER — Telehealth: Payer: Self-pay

## 2018-06-10 ENCOUNTER — Other Ambulatory Visit: Payer: Self-pay | Admitting: Internal Medicine

## 2018-06-10 DIAGNOSIS — E119 Type 2 diabetes mellitus without complications: Secondary | ICD-10-CM

## 2018-06-10 DIAGNOSIS — E782 Mixed hyperlipidemia: Secondary | ICD-10-CM

## 2018-06-10 NOTE — Telephone Encounter (Signed)
I finished refill approval in RX Refill box then she left message stating she was seen in Urgent Care BS elevated and they made change to dosage on meds. She has no insurance and needs to know what cost to be seen is here. ( 50 dollars day of and then get billed for the rest but not sure how much as each visit is different)

## 2018-06-10 NOTE — Telephone Encounter (Signed)
Called patient and left Vm informed its $50 plus whatever will be billed to her home. Also, informed her medications were sent to pharmacy. If she cannot come here I gave her the [facility] number to try and be seen there to get refills on medications in future.

## 2018-07-18 ENCOUNTER — Other Ambulatory Visit: Payer: Self-pay | Admitting: Internal Medicine

## 2018-07-18 DIAGNOSIS — I10 Essential (primary) hypertension: Secondary | ICD-10-CM

## 2018-08-03 ENCOUNTER — Other Ambulatory Visit: Payer: Self-pay | Admitting: Internal Medicine

## 2018-08-25 ENCOUNTER — Other Ambulatory Visit: Payer: Self-pay | Admitting: Internal Medicine

## 2018-08-25 DIAGNOSIS — E119 Type 2 diabetes mellitus without complications: Secondary | ICD-10-CM

## 2018-08-28 ENCOUNTER — Ambulatory Visit: Payer: PRIVATE HEALTH INSURANCE | Admitting: Internal Medicine

## 2018-09-02 ENCOUNTER — Other Ambulatory Visit
Admission: RE | Admit: 2018-09-02 | Discharge: 2018-09-02 | Disposition: A | Discharge status: Home / Self Care | Payer: No Typology Code available for payment source | Source: Ambulatory Visit | Attending: Internal Medicine | Admitting: Internal Medicine

## 2018-09-02 ENCOUNTER — Ambulatory Visit (INDEPENDENT_AMBULATORY_CARE_PROVIDER_SITE_OTHER): Payer: Self-pay | Admitting: Internal Medicine

## 2018-09-02 ENCOUNTER — Encounter: Payer: Self-pay | Admitting: Internal Medicine

## 2018-09-02 VITALS — BP 106/64 | HR 84 | Ht 61.0 in | Wt 144.4 lb

## 2018-09-02 DIAGNOSIS — E782 Mixed hyperlipidemia: Secondary | ICD-10-CM

## 2018-09-02 DIAGNOSIS — I10 Essential (primary) hypertension: Secondary | ICD-10-CM

## 2018-09-02 DIAGNOSIS — E119 Type 2 diabetes mellitus without complications: Secondary | ICD-10-CM | POA: Insufficient documentation

## 2018-09-02 LAB — LIPID PANEL
Cholesterol: 233 mg/dL — ABNORMAL HIGH (ref 0–200)
HDL: 76 mg/dL (ref >40)
LDL Cholesterol: 140 mg/dL — ABNORMAL HIGH (ref 0–99)
TRIGLYCERIDES: 86 mg/dL (ref <150)
Total CHOL/HDL Ratio: 3.1 RATIO
VLDL: 17 mg/dL (ref 0–40)

## 2018-09-02 LAB — COMPREHENSIVE METABOLIC PANEL
ALT: 15 U/L (ref 0–44)
AST: 20 U/L (ref 15–41)
Albumin: 4.5 g/dL (ref 3.5–5.0)
Alkaline Phosphatase: 94 U/L (ref 38–126)
Anion gap: 12 (ref 5–15)
BUN: 17 mg/dL (ref 6–20)
CHLORIDE: 101 mmol/L (ref 98–111)
CO2: 28 mmol/L (ref 22–32)
CREATININE: 0.82 mg/dL (ref 0.44–1.00)
Calcium: 10.3 mg/dL (ref 8.9–10.3)
GFR calc Af Amer: >60 mL/min (ref >60)
GFR calc non Af Amer: >60 mL/min (ref >60)
GLUCOSE: 167 mg/dL — AB (ref 70–99)
Potassium: 4.3 mmol/L (ref 3.5–5.1)
SODIUM: 141 mmol/L (ref 135–145)
Total Bilirubin: 0.7 mg/dL (ref 0.3–1.2)
Total Protein: 8.3 g/dL — ABNORMAL HIGH (ref 6.5–8.1)

## 2018-09-02 LAB — TSH: TSH: 1.567 u[IU]/mL (ref 0.350–4.500)

## 2018-09-02 LAB — HEMOGLOBIN A1C
Hgb A1c MFr Bld: 9.1 % — ABNORMAL HIGH (ref 4.8–5.6)
Mean Plasma Glucose: 214.47 mg/dL

## 2018-09-02 MED ORDER — METFORMIN HCL 1000 MG PO TABS: 1000.0000 mg | ORAL_TABLET | Freq: Two times a day (BID) | ORAL | 5 refills | Status: DC

## 2018-09-02 MED ORDER — LISINOPRIL 5 MG PO TABS: 5.0000 mg | ORAL_TABLET | Freq: Every day | ORAL | 5 refills | Status: DC

## 2018-09-02 MED ORDER — GLIMEPIRIDE 2 MG PO TABS: 2.0000 mg | ORAL_TABLET | Freq: Two times a day (BID) | ORAL | 5 refills | Status: DC

## 2018-09-02 MED ORDER — SIMVASTATIN 10 MG PO TABS: 10.0000 mg | ORAL_TABLET | Freq: Every day | ORAL | 5 refills | Status: DC

## 2018-09-02 NOTE — Progress Notes (Signed)
Date:  09/02/2018   Name:  Teresa Rivas   DOB:  06-04-1966   MRN:  [id number]   Chief Complaint: Diabetes (BS- this morning 237 about 45 mins after eating lunch.) and Hypertension  Diabetes  She presents for her follow-up diabetic visit. She has type 2 diabetes mellitus. Her disease course has been worsening. Pertinent negatives for hypoglycemia include no headaches or tremors. Pertinent negatives for diabetes include no chest pain, no fatigue, no polydipsia and no polyuria. Current diabetic treatment includes oral agent (dual therapy) (glimepiride and metformin - doubled when she was in the UC in July). She is following a generally healthy diet. She monitors blood glucose at home 1-2 x per week. An ACE inhibitor/angiotensin II receptor blocker is being taken. Eye exam is not current.  Hypertension  This is a chronic problem. Pertinent negatives include no chest pain, headaches, palpitations or shortness of breath. Risk factors for coronary artery disease include diabetes mellitus and dyslipidemia. Past treatments include ACE inhibitors.  Hyperlipidemia  This is a chronic problem. The problem is controlled. Pertinent negatives include no chest pain or shortness of breath. Current antihyperlipidemic treatment includes statins. Risk factors for coronary artery disease include diabetes mellitus and hypertension.   Lab Results  Component Value Date   HGBA1C 10.8 (H) 05/22/2017   Lab Results  Component Value Date   CREATININE 0.81 05/22/2017   BUN 13 05/22/2017   NA 137 05/22/2017   K 4.4 05/22/2017   CL 97 05/22/2017   CO2 24 05/22/2017    Review of Systems  Constitutional: Negative for appetite change, fatigue, fever and unexpected weight change.  HENT: Negative for tinnitus and trouble swallowing.   Eyes: Negative for visual disturbance.  Respiratory: Negative for cough, chest tightness and shortness of breath.   Cardiovascular: Negative for chest pain, palpitations and leg  swelling.  Gastrointestinal: Positive for diarrhea (frequent stools after increasing metformin). Negative for abdominal pain.  Endocrine: Negative for polydipsia and polyuria.  Genitourinary: Negative for dysuria and hematuria.  Musculoskeletal: Negative for arthralgias.  Neurological: Negative for tremors, numbness and headaches.  Psychiatric/Behavioral: Negative for dysphoric mood.    Patient Active Problem List   Diagnosis Date Noted  . 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 Type 2 diabetes mellitus without complication, without long-term current use of insulin (HCC) 05/22/2017  . Mixed hyperlipidemia 05/22/2017  . Essential hypertension 05/22/2017  . Renal stones 05/22/2017    Allergies  Allergen Reactions  . Codeine     Past Surgical History:  Procedure Laterality Date  . ABDOMINAL HYSTERECTOMY  07/28/2016   total due to fibroids  . TONSILLECTOMY      Social History   Tobacco Use  . Smoking status: Never Smoker  . Smokeless tobacco: Never Used  Substance Use Topics  . Alcohol use: No  . Drug use: No     Medication list has been reviewed and updated.  Current Meds  Medication Sig  . glimepiride (AMARYL) 2 MG tablet TAKE 1 TABLET (2 MG TOTAL) BY MOUTH DAILY BEFORE BREAKFAST.  Marland Kitchen glucose blood (ACCU-CHEK AVIVA) test strip Use as instructed  . ibuprofen (ADVIL,MOTRIN) 800 MG tablet Take 1 tablet (800 mg total) by mouth every 8 (eight) hours as needed.  Marland Kitchen lisinopril (PRINIVIL,ZESTRIL) 5 MG tablet TAKE 1 TABLET BY MOUTH EVERY DAY  . metFORMIN (GLUCOPHAGE) 1000 MG tablet TAKE 1 TABLET (1,000 MG TOTAL) BY MOUTH EVERY EVENING.  . simvastatin (ZOCOR) 10 MG tablet TAKE 1 TABLET BY MOUTH EVERY DAY  PHQ 2/9 Scores 09/26/2017  PHQ - 2 Score 0    Physical Exam  Constitutional: She is oriented to person, place, and time. She appears well-developed. No distress.  HENT:  Head: Normocephalic and atraumatic.  Neck: Normal range of motion. Neck supple. No  thyromegaly present.  Cardiovascular: Normal rate, regular rhythm and normal heart sounds.  Pulmonary/Chest: Effort normal and breath sounds normal. No respiratory distress.  Abdominal: Soft. Bowel sounds are normal. She exhibits no distension. There is no tenderness. There is no guarding.  Musculoskeletal: Normal range of motion. She exhibits no edema or tenderness.  Lymphadenopathy:    She has no cervical adenopathy.  Neurological: She is alert and oriented to person, place, and time.  Skin: Skin is warm and dry. No rash noted.  Psychiatric: She has a normal mood and affect. Her behavior is normal. Thought content normal.  Nursing note and vitals reviewed.   BP 106/64 (BP Location: Left Arm, Patient Position: Sitting, Cuff Size: Normal)   Pulse 84   Ht 5\' 1"  (1.549 m)   Wt 144 lb 6.4 oz (65.5 kg)   LMP  (LMP Unknown)   SpO2 100%   BMI 27.28 kg/m   Assessment and Plan: 1. Essential hypertension Controlled, continue current medications - lisinopril (PRINIVIL,ZESTRIL) 5 MG tablet; Take 1 tablet (5 mg total) by mouth daily.  Dispense: 30 tablet; Refill: 5  2. Type 2 diabetes mellitus without complication, without long-term current use of insulin (HCC) Continue current oral agents - take with food Check BS fasting May need to start insulin therapy - Comprehensive metabolic panel - Hemoglobin A1c - TSH - metFORMIN (GLUCOPHAGE) 1000 MG tablet; Take 1 tablet (1,000 mg total) by mouth 2 (two) times daily with a meal.  Dispense: 60 tablet; Refill: 5 - glimepiride (AMARYL) 2 MG tablet; Take 1 tablet (2 mg total) by mouth 2 (two) times daily.  Dispense: 60 tablet; Refill: 5  3. Mixed hyperlipidemia On statin therapy - Lipid panel - simvastatin (ZOCOR) 10 MG tablet; Take 1 tablet (10 mg total) by mouth daily.  Dispense: 30 tablet; Refill: 5   Partially dictated using Animal nutritionist. Any errors are unintentional.  Bari Edward, MD [facility]  09/02/2018

## 2018-09-06 ENCOUNTER — Telehealth: Payer: Self-pay

## 2018-09-06 NOTE — Telephone Encounter (Signed)
I would increase the simvastatin to 20 mg to start.  She can take 2 of her 10 mg pills and then call for a refill when out.  Or I can send in a new Rx.

## 2018-09-06 NOTE — Telephone Encounter (Signed)
Patient called back regarding labs.  Asked what she needs to increase her cholesterol medication to and she will do so. She stated she viewed her labs on My Chart.  Please Advise.

## 2018-09-06 NOTE — Telephone Encounter (Signed)
Called and left VM informing to take 2 10 mg tablets until out of supply and then call when running out of Rx. Told her if she prefers to have 20 mg tablets sent in to pharmacy instead - then told her to call back and inform us and I will send in Rx to pharmacy.

## 2019-01-06 ENCOUNTER — Other Ambulatory Visit: Payer: Self-pay

## 2019-01-06 ENCOUNTER — Ambulatory Visit (INDEPENDENT_AMBULATORY_CARE_PROVIDER_SITE_OTHER): Payer: BLUE CROSS/BLUE SHIELD | Admitting: Internal Medicine

## 2019-01-06 ENCOUNTER — Encounter: Payer: Self-pay | Admitting: Internal Medicine

## 2019-01-06 ENCOUNTER — Other Ambulatory Visit
Admission: RE | Admit: 2019-01-06 | Discharge: 2019-01-06 | Disposition: A | Discharge status: Home / Self Care | Payer: BLUE CROSS/BLUE SHIELD | Attending: Internal Medicine | Admitting: Internal Medicine

## 2019-01-06 VITALS — BP 122/74 | HR 77 | Ht 61.0 in | Wt 144.0 lb

## 2019-01-06 DIAGNOSIS — Z1211 Encounter for screening for malignant neoplasm of colon: Secondary | ICD-10-CM | POA: Diagnosis not present

## 2019-01-06 DIAGNOSIS — E119 Type 2 diabetes mellitus without complications: Secondary | ICD-10-CM

## 2019-01-06 DIAGNOSIS — Z1231 Encounter for screening mammogram for malignant neoplasm of breast: Secondary | ICD-10-CM | POA: Diagnosis not present

## 2019-01-06 DIAGNOSIS — E782 Mixed hyperlipidemia: Secondary | ICD-10-CM

## 2019-01-06 DIAGNOSIS — I10 Essential (primary) hypertension: Secondary | ICD-10-CM

## 2019-01-06 DIAGNOSIS — M7711 Lateral epicondylitis, right elbow: Secondary | ICD-10-CM

## 2019-01-06 LAB — COMPREHENSIVE METABOLIC PANEL
ALK PHOS: 101 U/L (ref 38–126)
ALT: 21 U/L (ref 0–44)
AST: 20 U/L (ref 15–41)
Albumin: 4.4 g/dL (ref 3.5–5.0)
Anion gap: 11 (ref 5–15)
BUN: 16 mg/dL (ref 6–20)
CALCIUM: 9.6 mg/dL (ref 8.9–10.3)
CO2: 26 mmol/L (ref 22–32)
CREATININE: 0.68 mg/dL (ref 0.44–1.00)
Chloride: 103 mmol/L (ref 98–111)
Glucose, Bld: 113 mg/dL — ABNORMAL HIGH (ref 70–99)
Potassium: 3.6 mmol/L (ref 3.5–5.1)
Sodium: 140 mmol/L (ref 135–145)
Total Bilirubin: 0.9 mg/dL (ref 0.3–1.2)
Total Protein: 7.9 g/dL (ref 6.5–8.1)

## 2019-01-06 LAB — HEMOGLOBIN A1C
HEMOGLOBIN A1C: 12.3 % — AB (ref 4.8–5.6)
MEAN PLASMA GLUCOSE: 306.31 mg/dL

## 2019-01-06 MED ORDER — IBUPROFEN 800 MG PO TABS: 800.0000 mg | ORAL_TABLET | Freq: Three times a day (TID) | ORAL | 0 refills | Status: DC | PRN

## 2019-01-06 MED ORDER — SIMVASTATIN 20 MG PO TABS: 20.0000 mg | ORAL_TABLET | Freq: Every day | ORAL | 1 refills | Status: DC

## 2019-01-06 NOTE — Progress Notes (Signed)
Date:  01/06/2019   Name:  Teresa Rivas   DOB:  03/23/1966   MRN:  [id number]   Chief Complaint: Diabetes She is overdue for mammogram, CRC screening, PPV-23 and diabetic eye exam. Diabetes  She presents for her follow-up diabetic visit. She has type 2 diabetes mellitus. Her disease course has been stable. Pertinent negatives for hypoglycemia include no dizziness, headaches or tremors. Pertinent negatives for diabetes include no chest pain, no fatigue, no polydipsia and no polyuria. Symptoms are stable. Current diabetic treatment includes oral agent (dual therapy). She is compliant with treatment most of the time. Her weight is stable. Her breakfast blood glucose is taken between 6-7 am. Her breakfast blood glucose range is generally 140-180 mg/dl. An ACE inhibitor/angiotensin II receptor blocker is being taken.  Hyperlipidemia  This is a chronic problem. The problem is uncontrolled. Pertinent negatives include no chest pain or shortness of breath. Current antihyperlipidemic treatment includes statins (zocor 10 mg - needs to increase dose as suggested after last visit). The current treatment provides moderate improvement of lipids.  Hypertension  This is a chronic problem. The problem is controlled. Pertinent negatives include no chest pain, headaches, palpitations or shortness of breath. Past treatments include ACE inhibitors. The current treatment provides significant improvement.  Elbow pain - uses Advil 800 mg as needed, due for a refill.   Lab Results  Component Value Date   HGBA1C 9.1 (H) 09/02/2018   Lab Results  Component Value Date   CHOL 233 (H) 09/02/2018   HDL 76 09/02/2018   LDLCALC 140 (H) 09/02/2018   TRIG 86 09/02/2018   CHOLHDL 3.1 09/02/2018   Lab Results  Component Value Date   CREATININE 0.82 09/02/2018   BUN 17 09/02/2018   NA 141 09/02/2018   K 4.3 09/02/2018   CL 101 09/02/2018   CO2 28 09/02/2018     Review of Systems  Constitutional: Negative for  appetite change, fatigue, fever and unexpected weight change.  HENT: Negative for tinnitus and trouble swallowing.   Eyes: Negative for visual disturbance.  Respiratory: Negative for cough, chest tightness and shortness of breath.   Cardiovascular: Negative for chest pain, palpitations and leg swelling.  Gastrointestinal: Negative for abdominal pain.  Endocrine: Negative for polydipsia and polyuria.  Genitourinary: Negative for dysuria and hematuria.  Musculoskeletal: Positive for arthralgias (elbow and low back).  Skin: Negative for color change and rash.  Neurological: Negative for dizziness, tremors, numbness and headaches.  Psychiatric/Behavioral: Negative for dysphoric mood and sleep disturbance.    Patient Active Problem List   Diagnosis Date Noted  . 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 Type 2 diabetes mellitus without complication, without long-term current use of insulin (HCC) 05/22/2017  . Mixed hyperlipidemia 05/22/2017  . Essential hypertension 05/22/2017  . Renal stones 05/22/2017    Allergies  Allergen Reactions  . Codeine     Past Surgical History:  Procedure Laterality Date  . ABDOMINAL HYSTERECTOMY  07/28/2016   total due to fibroids  . TONSILLECTOMY      Social History   Tobacco Use  . Smoking status: Never Smoker  . Smokeless tobacco: Never Used  Substance Use Topics  . Alcohol use: No  . Drug use: No     Medication list has been reviewed and updated.  Current Meds  Medication Sig  . glimepiride (AMARYL) 2 MG tablet Take 1 tablet (2 mg total) by mouth 2 (two) times daily.  Marland Kitchen glucose blood (ACCU-CHEK AVIVA) test strip Use as instructed  .  ibuprofen (ADVIL,MOTRIN) 800 MG tablet Take 1 tablet (800 mg total) by mouth every 8 (eight) hours as needed.  Marland Kitchen lisinopril (PRINIVIL,ZESTRIL) 5 MG tablet Take 1 tablet (5 mg total) by mouth daily.  . metFORMIN (GLUCOPHAGE) 1000 MG tablet Take 1 tablet (1,000 mg total) by mouth 2 (two) times daily with a  meal.  . simvastatin (ZOCOR) 10 MG tablet Take 1 tablet (10 mg total) by mouth daily.    PHQ 2/9 Scores 01/06/2019 09/26/2017  PHQ - 2 Score 0 0   Wt Readings from Last 3 Encounters:  01/06/19 144 lb (65.3 kg)  09/02/18 144 lb 6.4 oz (65.5 kg)  06/07/18 133 lb (60.3 kg)    Physical Exam Vitals signs and nursing note reviewed.  Constitutional:      General: She is not in acute distress.    Appearance: Normal appearance. She is well-developed.  HENT:     Head: Normocephalic and atraumatic.     Mouth/Throat:     Mouth: Mucous membranes are moist.  Neck:     Musculoskeletal: Normal range of motion and neck supple.     Vascular: No carotid bruit.  Cardiovascular:     Rate and Rhythm: Normal rate and regular rhythm.  Pulmonary:     Effort: Pulmonary effort is normal. No respiratory distress.  Abdominal:     General: Abdomen is flat.     Palpations: Abdomen is soft.  Musculoskeletal: Normal range of motion.  Lymphadenopathy:     Cervical: No cervical adenopathy.  Skin:    General: Skin is warm and dry.     Findings: No rash.  Neurological:     Mental Status: She is alert and oriented to person, place, and time.  Psychiatric:        Behavior: Behavior normal.        Thought Content: Thought content normal.     BP 122/74   Pulse 77   Ht 5\' 1"  (1.549 m)   Wt 144 lb (65.3 kg)   LMP  (LMP Unknown)   SpO2 100%   BMI 27.21 kg/m   Assessment and Plan: 1. Type 2 diabetes mellitus without complication, without long-term current use of insulin (HCC) Doing well, glucoses improving Continue metformin and glimepiride twice a day Schedule DM eye exam - Comprehensive metabolic panel - Hemoglobin A1c - Ambulatory referral to Ophthalmology  2. Essential hypertension controlled  3. Colon cancer screening refer - Ambulatory referral to Gastroenterology  4. Encounter for screening mammogram for breast cancer Schedule  - MM 3D SCREEN BREAST BILATERAL; Future  5.  Epicondylitis, lateral, right Continue advil as needed - ibuprofen (ADVIL,MOTRIN) 800 MG tablet; Take 1 tablet (800 mg total) by mouth every 8 (eight) hours as needed.  Dispense: 90 tablet; Refill: 0  6. Mixed hyperlipidemia Increase simvastatin to 20 mg  - simvastatin (ZOCOR) 20 MG tablet; Take 1 tablet (20 mg total) by mouth daily.  Dispense: 90 tablet; Refill: 1   Partially dictated using Animal nutritionist. Any errors are unintentional.  Bari Edward, MD [facility]  01/06/2019

## 2019-01-15 ENCOUNTER — Encounter: Payer: Self-pay | Admitting: Internal Medicine

## 2019-01-15 ENCOUNTER — Ambulatory Visit: Payer: BLUE CROSS/BLUE SHIELD | Admitting: Internal Medicine

## 2019-01-15 ENCOUNTER — Other Ambulatory Visit: Payer: Self-pay

## 2019-01-15 VITALS — BP 132/82 | HR 70 | Ht 61.0 in | Wt 142.0 lb

## 2019-01-15 DIAGNOSIS — E119 Type 2 diabetes mellitus without complications: Secondary | ICD-10-CM | POA: Diagnosis not present

## 2019-01-15 MED ORDER — SEMAGLUTIDE 3 MG PO TABS: 1.0000 | ORAL_TABLET | Freq: Every day | ORAL | 0 refills | Status: DC

## 2019-01-15 NOTE — Progress Notes (Signed)
Date:  01/15/2019   Name:  Teresa Rivas   DOB:  1966-11-19   MRN:  [id number]   Chief Complaint: Diabetes (New meds with instructions and samples. )  Diabetes  She presents for her follow-up diabetic visit. She has type 2 diabetes mellitus. Her disease course has been worsening. Pertinent negatives for hypoglycemia include no dizziness, headaches or tremors. Pertinent negatives for diabetes include no chest pain, no fatigue, no polydipsia and no polyuria. Current diabetic treatment includes oral agent (dual therapy) (metformin and glimepiride). She is compliant with treatment all of the time.  Since last visit she has changed her diet and is monitoring her BS regularly.  Range is 140 - 190. Lab Results  Component Value Date   HGBA1C 12.3 (H) 01/06/2019   Lab Results  Component Value Date   CHOL 233 (H) 09/02/2018   HDL 76 09/02/2018   LDLCALC 140 (H) 09/02/2018   TRIG 86 09/02/2018   CHOLHDL 3.1 09/02/2018     Review of Systems  Constitutional: Negative for appetite change, fatigue, fever and unexpected weight change.  HENT: Negative for tinnitus and trouble swallowing.   Eyes: Negative for visual disturbance.  Respiratory: Negative for cough, chest tightness and shortness of breath.   Cardiovascular: Negative for chest pain, palpitations and leg swelling.  Gastrointestinal: Negative for abdominal pain.  Endocrine: Negative for polydipsia and polyuria.  Genitourinary: Negative for dysuria and hematuria.  Musculoskeletal: Positive for arthralgias.  Skin: Negative for color change and rash.  Neurological: Negative for dizziness, tremors, numbness and headaches.  Psychiatric/Behavioral: Negative for dysphoric mood and sleep disturbance.    Patient Active Problem List   Diagnosis Date Noted  . 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 Type 2 diabetes mellitus without complication, without long-term current use of insulin (HCC) 05/22/2017  . Mixed hyperlipidemia  05/22/2017  . Essential hypertension 05/22/2017  . Renal stones 05/22/2017    Allergies  Allergen Reactions  . Codeine     Past Surgical History:  Procedure Laterality Date  . ABDOMINAL HYSTERECTOMY  07/28/2016   total due to fibroids  . TONSILLECTOMY      Social History   Tobacco Use  . Smoking status: Never Smoker  . Smokeless tobacco: Never Used  Substance Use Topics  . Alcohol use: No  . Drug use: No     Medication list has been reviewed and updated.  Current Meds  Medication Sig  . glimepiride (AMARYL) 2 MG tablet Take 1 tablet (2 mg total) by mouth 2 (two) times daily.  Marland Kitchen ibuprofen (ADVIL,MOTRIN) 800 MG tablet Take 1 tablet (800 mg total) by mouth every 8 (eight) hours as needed.  Marland Kitchen lisinopril (PRINIVIL,ZESTRIL) 5 MG tablet Take 1 tablet (5 mg total) by mouth daily.  . metFORMIN (GLUCOPHAGE) 1000 MG tablet Take 1 tablet (1,000 mg total) by mouth 2 (two) times daily with a meal.  . simvastatin (ZOCOR) 20 MG tablet Take 1 tablet (20 mg total) by mouth daily.  . [DISCONTINUED] glucose blood (ACCU-CHEK AVIVA) test strip Use as instructed    PHQ 2/9 Scores 01/15/2019 01/06/2019 09/26/2017  PHQ - 2 Score 0 0 0   Wt Readings from Last 3 Encounters:  01/15/19 142 lb (64.4 kg)  01/06/19 144 lb (65.3 kg)  09/02/18 144 lb 6.4 oz (65.5 kg)    Physical Exam Vitals signs and nursing note reviewed.  Constitutional:      General: She is not in acute distress.    Appearance: She is well-developed.  HENT:  Head: Normocephalic and atraumatic.  Cardiovascular:     Rate and Rhythm: Normal rate and regular rhythm.  Pulmonary:     Effort: Pulmonary effort is normal. No respiratory distress.     Breath sounds: Normal breath sounds.  Musculoskeletal: Normal range of motion.  Skin:    General: Skin is warm and dry.     Findings: No rash.  Neurological:     Mental Status: She is alert and oriented to person, place, and time.  Psychiatric:        Behavior: Behavior  normal.        Thought Content: Thought content normal.     BP 132/82   Pulse 70   Ht 5\' 1"  (1.549 m)   Wt 142 lb (64.4 kg)   LMP  (LMP Unknown)   SpO2 100%   BMI 26.83 kg/m   Assessment and Plan: 1. Type 2 diabetes mellitus without complication, without long-term current use of insulin (HCC) Sample of semaglutide 3 mg once a day given Continue other medications Continue diet, resume exercise and monitor BS daily - Semaglutide (RYBELSUS) 3 MG TABS; Take 1 tablet by mouth daily.  Dispense: 30 tablet; Refill: 0   Partially dictated using Animal nutritionist. Any errors are unintentional.  Bari Edward, MD [facility]  01/15/2019

## 2019-02-06 ENCOUNTER — Encounter: Payer: Self-pay | Admitting: Internal Medicine

## 2019-02-07 ENCOUNTER — Other Ambulatory Visit: Payer: Self-pay | Admitting: Internal Medicine

## 2019-02-07 DIAGNOSIS — E119 Type 2 diabetes mellitus without complications: Secondary | ICD-10-CM

## 2019-02-07 MED ORDER — SEMAGLUTIDE 7 MG PO TABS: 7.0000 mg | ORAL_TABLET | Freq: Every day | ORAL | 2 refills | Status: DC

## 2019-02-07 NOTE — Telephone Encounter (Signed)
Please advise patient message about medication ? ?

## 2019-03-10 ENCOUNTER — Encounter: Payer: Self-pay | Admitting: Internal Medicine

## 2019-03-11 NOTE — Telephone Encounter (Signed)
Do you want to do a Video call?

## 2019-03-12 ENCOUNTER — Other Ambulatory Visit: Payer: Self-pay

## 2019-03-12 ENCOUNTER — Ambulatory Visit (INDEPENDENT_AMBULATORY_CARE_PROVIDER_SITE_OTHER): Payer: BLUE CROSS/BLUE SHIELD | Admitting: Internal Medicine

## 2019-03-12 ENCOUNTER — Encounter: Payer: Self-pay | Admitting: Internal Medicine

## 2019-03-12 VITALS — BP 110/71 | HR 98 | Temp 98.6°F | Ht 61.0 in | Wt 130.5 lb

## 2019-03-12 DIAGNOSIS — E119 Type 2 diabetes mellitus without complications: Secondary | ICD-10-CM | POA: Diagnosis not present

## 2019-03-12 DIAGNOSIS — I10 Essential (primary) hypertension: Secondary | ICD-10-CM | POA: Diagnosis not present

## 2019-03-12 MED ORDER — SEMAGLUTIDE 3 MG PO TABS: 1.0000 | ORAL_TABLET | Freq: Every day | ORAL | 1 refills | Status: DC

## 2019-03-12 MED ORDER — GLIMEPIRIDE 2 MG PO TABS: 2.0000 mg | ORAL_TABLET | Freq: Two times a day (BID) | ORAL | 1 refills | Status: DC

## 2019-03-12 NOTE — Progress Notes (Signed)
Date:  03/12/2019   Name:  Teresa ShockShanti Jackson   DOB:  May 21, 1966   MRN:  [id number]  This encounter was conducted via video encounter due to the need for social distancing in light of the Covid-19 pandemic.  The patient was correctly identified.  I advised that I am conducting the visit from a secure room [facility].   The limitations of this form of encounter were discussed with the patient and he/she agreed to proceed.  Chief Complaint: Diabetes (Follow up. BS today is 3993. )  Diabetes  She presents for her follow-up diabetic visit. She has type 2 diabetes mellitus. Her disease course has been improving. Pertinent negatives for hypoglycemia include no dizziness or headaches. Pertinent negatives for diabetes include no chest pain and no fatigue. Current diabetic treatments: metformin, glimepiride with semaglutide added 2 months ago. Her weight is decreasing steadily. She monitors blood glucose at home 1-2 x per day. Her breakfast blood glucose is taken between 6-7 am. Her breakfast blood glucose range is generally 90-110 mg/dl. An ACE inhibitor/angiotensin II receptor blocker is being taken.  Hypertension  This is a chronic problem. The problem is controlled. Pertinent negatives include no chest pain, headaches, palpitations or shortness of breath. Past treatments include ACE inhibitors.   She did well with the 3 mg Rybelsus but when she increased to 7 mg she had nausea and more appetite suppression. She has lost about 12 lbs since starting medication and does not want to lose much more. She has also had a few low BS readings requiring her to drink juice.  Lab Results  Component Value Date   HGBA1C 12.3 (H) 01/06/2019   Lab Results  Component Value Date   CREATININE 0.68 01/06/2019   BUN 16 01/06/2019   NA 140 01/06/2019   K 3.6 01/06/2019   CL 103 01/06/2019   CO2 26 01/06/2019      Review of Systems  Constitutional: Positive for unexpected weight change.  Negative for fatigue and fever.  Eyes: Negative for visual disturbance.  Respiratory: Negative for chest tightness and shortness of breath.   Cardiovascular: Negative for chest pain, palpitations and leg swelling.  Gastrointestinal: Negative for abdominal pain, constipation and diarrhea.  Neurological: Negative for dizziness and headaches.  Psychiatric/Behavioral: Negative for sleep disturbance.    Patient Active Problem List   Diagnosis Date Noted  . 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 Type 2 diabetes mellitus without complication, without long-term current use of insulin (HCC) 05/22/2017  . Mixed hyperlipidemia 05/22/2017  . Essential hypertension 05/22/2017  . Renal stones 05/22/2017    Allergies  Allergen Reactions  . Codeine     Past Surgical History:  Procedure Laterality Date  . ABDOMINAL HYSTERECTOMY  07/28/2016   total due to fibroids  . TONSILLECTOMY      Social History   Tobacco Use  . Smoking status: Never Smoker  . Smokeless tobacco: Never Used  Substance Use Topics  . Alcohol use: No  . Drug use: No     Medication list has been reviewed and updated.  Current Meds  Medication Sig  . glimepiride (AMARYL) 2 MG tablet Take 1 tablet (2 mg total) by mouth 2 (two) times daily.  Marland Kitchen. glucose blood (ACCU-CHEK GUIDE) test strip 1 each by Other route as needed for other. Use as instructed  . ibuprofen (ADVIL,MOTRIN) 800 MG tablet Take 1 tablet (800 mg total) by mouth every 8 (eight) hours as needed.  Marland Kitchen. lisinopril (  PRINIVIL,ZESTRIL) 5 MG tablet Take 1 tablet (5 mg total) by mouth daily.  . metFORMIN (GLUCOPHAGE) 1000 MG tablet Take 1 tablet (1,000 mg total) by mouth 2 (two) times daily with a meal.  . simvastatin (ZOCOR) 20 MG tablet Take 1 tablet (20 mg total) by mouth daily.  . [DISCONTINUED] glimepiride (AMARYL) 2 MG tablet Take 1 tablet (2 mg total) by mouth 2 (two) times daily.  . [DISCONTINUED] Semaglutide 7 MG TABS Take 7 mg by mouth daily.    PHQ  2/9 Scores 03/12/2019 01/15/2019 01/06/2019 09/26/2017  PHQ - 2 Score 0 0 0 0    BP Readings from Last 3 Encounters:  03/12/19 110/71  01/15/19 132/82  01/06/19 122/74    Physical Exam Constitutional:      Appearance: Normal appearance.  HENT:     Head: Normocephalic.  Pulmonary:     Effort: Pulmonary effort is normal.  Neurological:     Mental Status: She is alert.  Psychiatric:        Attention and Perception: Attention normal.        Mood and Affect: Mood normal.        Speech: Speech normal.        Behavior: Behavior normal.        Cognition and Memory: Cognition normal.     Wt Readings from Last 3 Encounters:  03/12/19 130 lb 8 oz (59.2 kg)  01/15/19 142 lb (64.4 kg)  01/06/19 144 lb (65.3 kg)    BP 110/71   Pulse 98   Temp 98.6 F (37 C)   Ht 5\' 1"  (1.549 m)   Wt 130 lb 8 oz (59.2 kg)   LMP  (LMP Unknown)   BMI 24.66 kg/m   Assessment and Plan: 1. Type 2 diabetes mellitus without complication, without long-term current use of insulin (HCC) Reminded pt to take amaryl with food Decrease Semaglutide to 3 mg and continue to monitor BS - glimepiride (AMARYL) 2 MG tablet; Take 1 tablet (2 mg total) by mouth 2 (two) times daily.  Dispense: 180 tablet; Refill: 1 - Semaglutide 3 MG TABS; Take 1 tablet by mouth daily.  Dispense: 90 tablet; Refill: 1  2. Essential hypertension controlled  I spent 12 minutes on this encounter. Partially dictated using Animal nutritionist. Any errors are unintentional.  Bari Edward, MD [facility]  03/12/2019

## 2019-03-12 NOTE — Patient Instructions (Signed)
Remember to take the glimepiride with food.  Reduce the Rybelsus dose to 3 mg per day.  Continue to monitor your blood pressure and blood sugars.  Call to schedule an in-person visit in 2 months for follow up and labs (no need to fast).

## 2019-03-19 ENCOUNTER — Ambulatory Visit: Payer: BLUE CROSS/BLUE SHIELD | Admitting: Internal Medicine

## 2019-04-10 ENCOUNTER — Other Ambulatory Visit: Payer: Self-pay | Admitting: Internal Medicine

## 2019-04-10 DIAGNOSIS — I10 Essential (primary) hypertension: Secondary | ICD-10-CM

## 2019-05-07 ENCOUNTER — Ambulatory Visit: Payer: BLUE CROSS/BLUE SHIELD | Admitting: Internal Medicine

## 2019-05-13 ENCOUNTER — Other Ambulatory Visit
Admission: RE | Admit: 2019-05-13 | Discharge: 2019-05-13 | Disposition: A | Discharge status: Home / Self Care | Payer: BLUE CROSS/BLUE SHIELD | Attending: Internal Medicine | Admitting: Internal Medicine

## 2019-05-13 ENCOUNTER — Encounter: Payer: Self-pay | Admitting: Internal Medicine

## 2019-05-13 ENCOUNTER — Ambulatory Visit: Payer: BLUE CROSS/BLUE SHIELD | Admitting: Internal Medicine

## 2019-05-13 ENCOUNTER — Other Ambulatory Visit: Payer: Self-pay

## 2019-05-13 VITALS — BP 116/78 | HR 76 | Ht 61.0 in | Wt 129.6 lb

## 2019-05-13 DIAGNOSIS — E119 Type 2 diabetes mellitus without complications: Secondary | ICD-10-CM

## 2019-05-13 DIAGNOSIS — I10 Essential (primary) hypertension: Secondary | ICD-10-CM | POA: Diagnosis not present

## 2019-05-13 LAB — COMPREHENSIVE METABOLIC PANEL
ALT: 12 U/L (ref 0–44)
AST: 20 U/L (ref 15–41)
Albumin: 4.6 g/dL (ref 3.5–5.0)
Alkaline Phosphatase: 70 U/L (ref 38–126)
Anion gap: 9 (ref 5–15)
BUN: 19 mg/dL (ref 6–20)
CO2: 27 mmol/L (ref 22–32)
Calcium: 10 mg/dL (ref 8.9–10.3)
Chloride: 101 mmol/L (ref 98–111)
Creatinine, Ser: 0.64 mg/dL (ref 0.44–1.00)
GFR calc Af Amer: >60 mL/min (ref >60)
GFR calc non Af Amer: >60 mL/min (ref >60)
Glucose, Bld: 68 mg/dL — ABNORMAL LOW (ref 70–99)
Potassium: 4.4 mmol/L (ref 3.5–5.1)
Sodium: 137 mmol/L (ref 135–145)
Total Bilirubin: 0.7 mg/dL (ref 0.3–1.2)
Total Protein: 8.3 g/dL — ABNORMAL HIGH (ref 6.5–8.1)

## 2019-05-13 LAB — HEMOGLOBIN A1C
Hgb A1c MFr Bld: 5.6 % (ref 4.8–5.6)
Mean Plasma Glucose: 114.02 mg/dL

## 2019-05-13 MED ORDER — METFORMIN HCL 1000 MG PO TABS: 1000.0000 mg | ORAL_TABLET | Freq: Two times a day (BID) | ORAL | 1 refills | Status: DC

## 2019-05-13 NOTE — Progress Notes (Signed)
Date:  05/13/2019   Name:  Teresa Rivas   DOB:  1966/03/18   MRN:  [id number]   Chief Complaint: Diabetes (Follow up with labs since 2 months ago she did Telephone visit. A1C and CMP.)  Diabetes She presents for her follow-up diabetic visit. She has type 2 diabetes mellitus. Her disease course has been improving. Hypoglycemia symptoms include sweats. Pertinent negatives for hypoglycemia include no headaches or tremors. Pertinent negatives for diabetes include no chest pain, no fatigue, no polydipsia and no polyuria. Symptoms are improving. Current diabetic treatment includes oral agent (triple therapy). She is compliant with treatment all of the time. Her weight is decreasing steadily. She is following a generally healthy diet. Her home blood glucose trend is decreasing steadily. Her breakfast blood glucose is taken between 7-8 am. Her breakfast blood glucose range is generally 110-130 mg/dl. An ACE inhibitor/angiotensin II receptor blocker is being taken.  Hypertension This is a chronic problem. The problem is controlled. Associated symptoms include sweats. Pertinent negatives include no chest pain, headaches, palpitations or shortness of breath. Past treatments include ACE inhibitors.    Review of Systems  Constitutional: Negative for appetite change, fatigue, fever and unexpected weight change.  HENT: Negative for tinnitus and trouble swallowing.   Eyes: Negative for visual disturbance.  Respiratory: Negative for cough, chest tightness and shortness of breath.   Cardiovascular: Negative for chest pain, palpitations and leg swelling.  Gastrointestinal: Negative for abdominal pain.  Endocrine: Negative for polydipsia and polyuria.  Genitourinary: Negative for dysuria and hematuria.  Musculoskeletal: Negative for arthralgias.  Neurological: Negative for tremors, numbness and headaches.  Psychiatric/Behavioral: Negative for dysphoric mood.    Patient Active Problem List   Diagnosis  Date Noted  . 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 Type 2 diabetes mellitus without complication, without long-term current use of insulin ([place]) 05/22/2017  . Mixed hyperlipidemia 05/22/2017  . Essential hypertension 05/22/2017  . Renal stones 05/22/2017    Allergies  Allergen Reactions  . Codeine     Past Surgical History:  Procedure Laterality Date  . ABDOMINAL HYSTERECTOMY  07/28/2016   total due to fibroids  . TONSILLECTOMY      Social History   Tobacco Use  . Smoking status: Never Smoker  . Smokeless tobacco: Never Used  Substance Use Topics  . Alcohol use: No  . Drug use: No     Medication list has been reviewed and updated.  Current Meds  Medication Sig  . glimepiride (AMARYL) 2 MG tablet Take 1 tablet (2 mg total) by mouth 2 (two) times daily.  Marland Kitchen glucose blood (ACCU-CHEK GUIDE) test strip 1 each by Other route as needed for other. Use as instructed  . ibuprofen (ADVIL,MOTRIN) 800 MG tablet Take 1 tablet (800 mg total) by mouth every 8 (eight) hours as needed.  Marland Kitchen lisinopril (ZESTRIL) 5 MG tablet Take 1 tablet by mouth once daily  . metFORMIN (GLUCOPHAGE) 1000 MG tablet Take 1 tablet (1,000 mg total) by mouth 2 (two) times daily with a meal.  . Semaglutide 3 MG TABS Take 1 tablet by mouth daily.  . simvastatin (ZOCOR) 20 MG tablet Take 1 tablet (20 mg total) by mouth daily.    PHQ 2/9 Scores 05/13/2019 03/12/2019 01/15/2019 01/06/2019  PHQ - 2 Score 0 0 0 0    BP Readings from Last 3 Encounters:  05/13/19 116/78  03/12/19 110/71  01/15/19 132/82    Physical Exam Vitals signs and nursing note reviewed.  Constitutional:  General: She is not in acute distress.    Appearance: She is well-developed.  HENT:     Head: Normocephalic and atraumatic.  Neck:     Musculoskeletal: Normal range of motion and neck supple.  Cardiovascular:     Rate and Rhythm: Normal rate and regular rhythm.     Heart sounds: No murmur.  Pulmonary:     Effort: Pulmonary  effort is normal. No respiratory distress.     Breath sounds: No wheezing or rhonchi.  Musculoskeletal: Normal range of motion.     Right lower leg: No edema.     Left lower leg: No edema.  Lymphadenopathy:     Cervical: No cervical adenopathy.  Skin:    General: Skin is warm and dry.     Capillary Refill: Capillary refill takes less than 2 seconds.     Findings: No rash.  Neurological:     Mental Status: She is alert and oriented to person, place, and time.  Psychiatric:        Behavior: Behavior normal.        Thought Content: Thought content normal.     Wt Readings from Last 3 Encounters:  05/13/19 129 lb 9.6 oz (58.8 kg)  03/12/19 130 lb 8 oz (59.2 kg)  01/15/19 142 lb (64.4 kg)    BP 116/78   Pulse 76   Ht 5\' 1"  (1.549 m)   Wt 129 lb 9.6 oz (58.8 kg)   LMP  (LMP Unknown)   SpO2 100%   BMI 24.49 kg/m   Assessment and Plan: 1. Type 2 diabetes mellitus without complication, without long-term current use of insulin (HCC) Home glucoses much improved, weight is down 15 lbs May need to stop glimepiride if low BS sx continue - Comprehensive metabolic panel - Hemoglobin A1c - metFORMIN (GLUCOPHAGE) 1000 MG tablet; Take 1 tablet (1,000 mg total) by mouth 2 (two) times daily with a meal.  Dispense: 180 tablet; Refill: 1  2. Essential hypertension controlled   Partially dictated using Animal nutritionistDragon software. Any errors are unintentional.  Bari EdwardLaura Shakiah Wester, MD [facility]  05/13/2019

## 2019-06-10 ENCOUNTER — Other Ambulatory Visit: Payer: Self-pay

## 2019-06-10 ENCOUNTER — Ambulatory Visit
Admission: RE | Admit: 2019-06-10 | Discharge: 2019-06-10 | Disposition: A | Discharge status: Home / Self Care | Payer: BLUE CROSS/BLUE SHIELD | Source: Ambulatory Visit | Attending: Internal Medicine | Admitting: Internal Medicine

## 2019-06-10 DIAGNOSIS — Z1231 Encounter for screening mammogram for malignant neoplasm of breast: Secondary | ICD-10-CM | POA: Diagnosis present

## 2019-09-02 ENCOUNTER — Ambulatory Visit (INDEPENDENT_AMBULATORY_CARE_PROVIDER_SITE_OTHER): Payer: BLUE CROSS/BLUE SHIELD | Admitting: Internal Medicine

## 2019-09-02 ENCOUNTER — Other Ambulatory Visit: Payer: Self-pay

## 2019-09-02 ENCOUNTER — Encounter: Payer: Self-pay | Admitting: Internal Medicine

## 2019-09-02 VITALS — BP 124/78 | HR 84 | Resp 16 | Ht 61.0 in | Wt 128.0 lb

## 2019-09-02 DIAGNOSIS — Z23 Encounter for immunization: Secondary | ICD-10-CM

## 2019-09-02 DIAGNOSIS — E118 Type 2 diabetes mellitus with unspecified complications: Secondary | ICD-10-CM | POA: Diagnosis not present

## 2019-09-02 DIAGNOSIS — E782 Mixed hyperlipidemia: Secondary | ICD-10-CM | POA: Diagnosis not present

## 2019-09-02 DIAGNOSIS — Z1211 Encounter for screening for malignant neoplasm of colon: Secondary | ICD-10-CM | POA: Diagnosis not present

## 2019-09-02 DIAGNOSIS — Z0000 Encounter for general adult medical examination without abnormal findings: Secondary | ICD-10-CM

## 2019-09-02 DIAGNOSIS — I10 Essential (primary) hypertension: Secondary | ICD-10-CM | POA: Diagnosis not present

## 2019-09-02 DIAGNOSIS — M62830 Muscle spasm of back: Secondary | ICD-10-CM | POA: Diagnosis not present

## 2019-09-02 LAB — POCT URINALYSIS DIPSTICK
Bilirubin, UA: NEGATIVE
Blood, UA: NEGATIVE
Glucose, UA: NEGATIVE
Ketones, UA: NEGATIVE
Leukocytes, UA: NEGATIVE
Nitrite, UA: NEGATIVE
Protein, UA: NEGATIVE
Spec Grav, UA: 1.025 (ref 1.010–1.025)
Urobilinogen, UA: 0.2 E.U./dL
pH, UA: 6 (ref 5.0–8.0)

## 2019-09-02 MED ORDER — SIMVASTATIN 20 MG PO TABS: 20.0000 mg | ORAL_TABLET | Freq: Every day | ORAL | 1 refills | Status: DC

## 2019-09-02 MED ORDER — LISINOPRIL 5 MG PO TABS: 5.0000 mg | ORAL_TABLET | Freq: Every day | ORAL | 0 refills | Status: DC

## 2019-09-02 MED ORDER — METHOCARBAMOL 500 MG PO TABS: 500.0000 mg | ORAL_TABLET | Freq: Every day | ORAL | 2 refills | Status: DC
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
## 2019-09-02 NOTE — Patient Instructions (Addendum)
Schedule Diabetic eye exam  [employer] heat wraps

## 2019-09-03 LAB — COMPREHENSIVE METABOLIC PANEL
ALT: 11 IU/L (ref 0–32)
AST: 20 IU/L (ref 0–40)
Albumin/Globulin Ratio: 1.8 (ref 1.2–2.2)
Albumin: 4.9 g/dL (ref 3.8–4.9)
Alkaline Phosphatase: 91 IU/L (ref 39–117)
BUN/Creatinine Ratio: 21 (ref 9–23)
BUN: 16 mg/dL (ref 6–24)
Bilirubin Total: 0.3 mg/dL (ref 0.0–1.2)
CO2: 24 mmol/L (ref 20–29)
Calcium: 10.4 mg/dL — ABNORMAL HIGH (ref 8.7–10.2)
Chloride: 102 mmol/L (ref 96–106)
Creatinine, Ser: 0.77 mg/dL (ref 0.57–1.00)
GFR calc Af Amer: 102 mL/min/{1.73_m2} (ref >59)
GFR calc non Af Amer: 88 mL/min/{1.73_m2} (ref >59)
Globulin, Total: 2.7 g/dL (ref 1.5–4.5)
Glucose: 99 mg/dL (ref 65–99)
Potassium: 4.8 mmol/L (ref 3.5–5.2)
Sodium: 140 mmol/L (ref 134–144)
Total Protein: 7.6 g/dL (ref 6.0–8.5)

## 2019-09-03 LAB — CBC WITH DIFFERENTIAL/PLATELET
Basophils Absolute: 0 10*3/uL (ref 0.0–0.2)
Basos: 1 %
EOS (ABSOLUTE): 0.1 10*3/uL (ref 0.0–0.4)
Eos: 1 %
Hematocrit: 39.3 % (ref 34.0–46.6)
Hemoglobin: 12.9 g/dL (ref 11.1–15.9)
Immature Grans (Abs): 0 10*3/uL (ref 0.0–0.1)
Immature Granulocytes: 0 %
Lymphocytes Absolute: 2.2 10*3/uL (ref 0.7–3.1)
Lymphs: 35 %
MCH: 26.3 pg — ABNORMAL LOW (ref 26.6–33.0)
MCHC: 32.8 g/dL (ref 31.5–35.7)
MCV: 80 fL (ref 79–97)
Monocytes Absolute: 0.3 10*3/uL (ref 0.1–0.9)
Monocytes: 5 %
Neutrophils Absolute: 3.6 10*3/uL (ref 1.4–7.0)
Neutrophils: 58 %
Platelets: 271 10*3/uL (ref 150–450)
RBC: 4.91 x10E6/uL (ref 3.77–5.28)
RDW: 12.5 % (ref 11.7–15.4)
WBC: 6.3 10*3/uL (ref 3.4–10.8)

## 2019-09-03 LAB — HEMOGLOBIN A1C
Est. average glucose Bld gHb Est-mCnc: 126 mg/dL
Hgb A1c MFr Bld: 6 % — ABNORMAL HIGH (ref 4.8–5.6)

## 2019-09-03 LAB — LIPID PANEL
Chol/HDL Ratio: 2.9 ratio (ref 0.0–4.4)
Cholesterol, Total: 229 mg/dL — ABNORMAL HIGH (ref 100–199)
HDL: 78 mg/dL (ref >39)
LDL Chol Calc (NIH): 143 mg/dL — ABNORMAL HIGH (ref 0–99)
Triglycerides: 47 mg/dL (ref 0–149)
VLDL Cholesterol Cal: 8 mg/dL (ref 5–40)

## 2019-09-03 LAB — TSH: TSH: 2.07 u[IU]/mL (ref 0.450–4.500)

## 2019-09-26 ENCOUNTER — Encounter: Payer: Self-pay | Admitting: Internal Medicine

## 2019-11-28 ENCOUNTER — Other Ambulatory Visit: Payer: Self-pay | Admitting: Internal Medicine

## 2019-11-28 DIAGNOSIS — E119 Type 2 diabetes mellitus without complications: Secondary | ICD-10-CM

## 2019-11-28 DIAGNOSIS — I10 Essential (primary) hypertension: Secondary | ICD-10-CM

## 2019-12-03 ENCOUNTER — Encounter: Payer: Self-pay | Admitting: Internal Medicine

## 2019-12-04 NOTE — Telephone Encounter (Signed)
Patient response to your message

## 2019-12-08 ENCOUNTER — Other Ambulatory Visit: Payer: Self-pay | Admitting: Internal Medicine

## 2019-12-09 ENCOUNTER — Telehealth: Payer: Self-pay

## 2019-12-09 NOTE — Telephone Encounter (Signed)
Completed PA on pt medication Rybelsus 3mg  tab with [email or web address]  Awaiting outcome from insurance.

## 2019-12-10 NOTE — Telephone Encounter (Signed)
Received FAX from pt insurance saying PA is NOT required for this medication. Msged patient on mychart to inform since this is her preferred method of communication with our clinic.  Lovett Calender, CMA

## 2019-12-25 ENCOUNTER — Ambulatory Visit (INDEPENDENT_AMBULATORY_CARE_PROVIDER_SITE_OTHER): Payer: 59 | Admitting: Internal Medicine

## 2019-12-25 ENCOUNTER — Encounter: Payer: Self-pay | Admitting: Internal Medicine

## 2019-12-25 ENCOUNTER — Other Ambulatory Visit: Payer: Self-pay

## 2019-12-25 VITALS — BP 128/78 | HR 72 | Temp 98.2°F | Ht 61.0 in | Wt 133.0 lb

## 2019-12-25 DIAGNOSIS — K047 Periapical abscess without sinus: Secondary | ICD-10-CM

## 2019-12-25 DIAGNOSIS — I10 Essential (primary) hypertension: Secondary | ICD-10-CM

## 2019-12-25 MED ORDER — LISINOPRIL 5 MG PO TABS: 5.0000 mg | ORAL_TABLET | Freq: Every day | ORAL | 3 refills | Status: DC

## 2019-12-25 MED ORDER — AMOXICILLIN-POT CLAVULANATE 875-125 MG PO TABS: 1.0000 | ORAL_TABLET | Freq: Two times a day (BID) | ORAL | 0 refills | Status: AC

## 2019-12-25 NOTE — Progress Notes (Signed)
Date:  12/25/2019   Name:  Teresa Rivas   DOB:  05-08-1966   MRN:  [id number]   Chief Complaint: Oral Swelling (3 days ago gums were swelling behind top teeth. Lymph node is swollen on left side of neck and tender to touch. )  Dental Pain  This is a new (gum swelling) problem. The current episode started in the past 7 days. The problem occurs constantly. The problem has been unchanged. The patient is experiencing no pain. Associated symptoms include thermal sensitivity. Pertinent negatives include no facial pain, fever, oral bleeding or sinus pressure.    Lab Results  Component Value Date   CREATININE 0.77 09/02/2019   BUN 16 09/02/2019   NA 140 09/02/2019   K 4.8 09/02/2019   CL 102 09/02/2019   CO2 24 09/02/2019   Lab Results  Component Value Date   CHOL 229 (H) 09/02/2019   HDL 78 09/02/2019   LDLCALC 143 (H) 09/02/2019   TRIG 47 09/02/2019   CHOLHDL 2.9 09/02/2019   Lab Results  Component Value Date   TSH 2.070 09/02/2019   Lab Results  Component Value Date   HGBA1C 6.0 (H) 09/02/2019     Review of Systems  Constitutional: Negative for chills, fatigue and fever.  HENT: Positive for dental problem. Negative for congestion, sinus pressure and sore throat.   Respiratory: Negative for cough and shortness of breath.   Cardiovascular: Negative for chest pain.  Hematological: Positive for adenopathy.    Patient Active Problem List   Diagnosis Date Noted  . 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 Type II diabetes mellitus with complication ([place]) 86/76/7209  . Mixed hyperlipidemia 05/22/2017  . Essential hypertension 05/22/2017  . Renal stones 05/22/2017    Allergies  Allergen Reactions  . Codeine     Past Surgical History:  Procedure Laterality Date  . ABDOMINAL HYSTERECTOMY  07/28/2016   total due to fibroids  . TONSILLECTOMY      Social History   Tobacco Use  . Smoking status: Never Smoker  . Smokeless tobacco: Never Used  Substance Use  Topics  . Alcohol use: No  . Drug use: No     Medication list has been reviewed and updated.  Current Meds  Medication Sig  . glucose blood (ACCU-CHEK GUIDE) test strip 1 each by Other route as needed for other. Use as instructed  . ibuprofen (ADVIL,MOTRIN) 800 MG tablet Take 1 tablet (800 mg total) by mouth every 8 (eight) hours as needed.  Marland Kitchen lisinopril (ZESTRIL) 5 MG tablet Take 1 tablet by mouth once daily  . metFORMIN (GLUCOPHAGE) 1000 MG tablet Take 1 tablet (1,000 mg total) by mouth 2 (two) times daily with a meal.  . methocarbamol (ROBAXIN) 500 MG tablet Take 1 tablet (500 mg total) by mouth at bedtime.  Marland Kitchen omeprazole (PRILOSEC) 40 MG capsule Take 40 mg by mouth daily.  . RYBELSUS 3 MG TABS Take 1 tablet by mouth once daily  . simvastatin (ZOCOR) 20 MG tablet Take 1 tablet (20 mg total) by mouth daily.    PHQ 2/9 Scores 12/25/2019 09/02/2019 05/13/2019 03/12/2019  PHQ - 2 Score 0 0 0 0    BP Readings from Last 3 Encounters:  12/25/19 128/78  09/02/19 124/78  05/13/19 116/78    Physical Exam Vitals and nursing note reviewed.  Constitutional:      General: She is not in acute distress.    Appearance: She is well-developed.  HENT:     Head: Normocephalic  and atraumatic.     Right Ear: Tympanic membrane and ear canal normal.     Left Ear: Tympanic membrane and ear canal normal.     Mouth/Throat:   Cardiovascular:     Rate and Rhythm: Normal rate and regular rhythm.     Pulses: Normal pulses.  Pulmonary:     Effort: Pulmonary effort is normal. No respiratory distress.     Breath sounds: No wheezing or rhonchi.  Musculoskeletal:        General: Normal range of motion.     Cervical back: Normal range of motion.  Lymphadenopathy:     Cervical: Cervical adenopathy (on left) present.     Left cervical: Superficial cervical adenopathy present.  Skin:    General: Skin is warm and dry.     Findings: No rash.  Neurological:     Mental Status: She is alert and oriented to  person, place, and time.  Psychiatric:        Behavior: Behavior normal.        Thought Content: Thought content normal.     Wt Readings from Last 3 Encounters:  12/25/19 133 lb (60.3 kg)  09/02/19 128 lb (58.1 kg)  05/13/19 129 lb 9.6 oz (58.8 kg)    BP 128/78   Pulse 72   Temp 98.2 F (36.8 C) (Oral)   Ht 5\' 1"  (1.549 m)   Wt 133 lb (60.3 kg)   LMP  (LMP Unknown)   SpO2 100%   BMI 25.13 kg/m   Assessment and Plan: 1. Dental infection Recommend establishing care with a local dentist for routine care - amoxicillin-clavulanate (AUGMENTIN) 875-125 MG tablet; Take 1 tablet by mouth 2 (two) times daily for 10 days.  Dispense: 20 tablet; Refill: 0  2. Essential hypertension Blood pressure is well controlled. - lisinopril (ZESTRIL) 5 MG tablet; Take 1 tablet (5 mg total) by mouth daily.  Dispense: 90 tablet; Refill: 3   Partially dictated using . Any errors are unintentional.  Animal nutritionist, MD [facility]  12/25/2019

## 2019-12-25 NOTE — Patient Instructions (Addendum)
[facility]  [facility] - Dr. Hildred Laser  Find a Dentist

## 2020-01-01 ENCOUNTER — Ambulatory Visit: Admit: 2020-01-01 | Payer: BLUE CROSS/BLUE SHIELD | Admitting: Internal Medicine

## 2020-01-01 SURGERY — COLONOSCOPY WITH PROPOFOL
Anesthesia: General

## 2020-01-07 IMAGING — MG DIGITAL SCREENING BILATERAL MAMMOGRAM WITH TOMO AND CAD
8 series · 8 of 24 positions shown · non-contrast
Comparison: Previous exam(s).

CLINICAL DATA: Screening.

EXAM:
DIGITAL SCREENING BILATERAL MAMMOGRAM WITH TOMO AND CAD

[L MLO synth-2D]
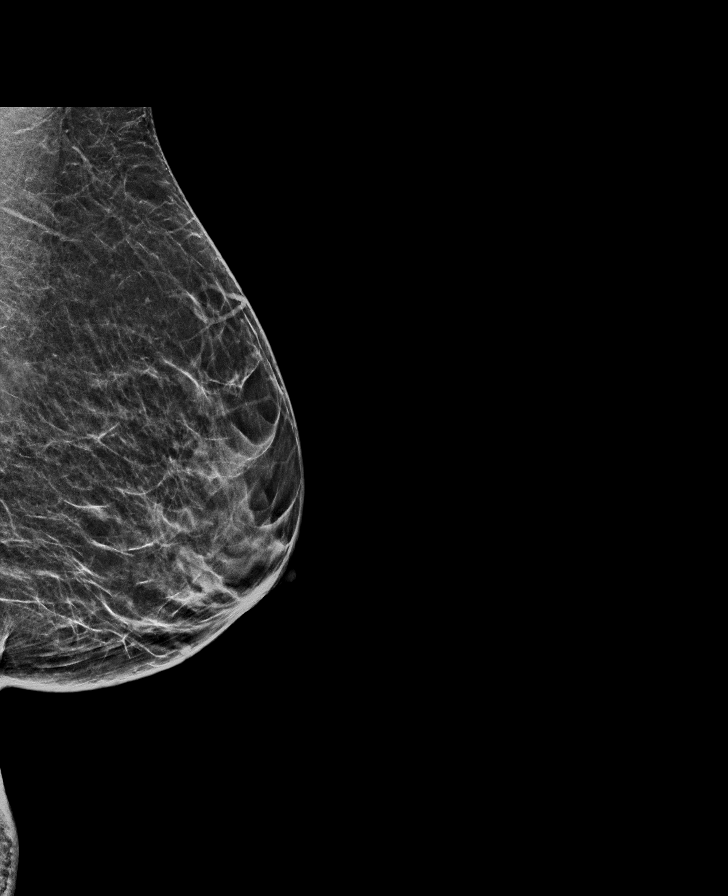

[R MLO synth-2D]
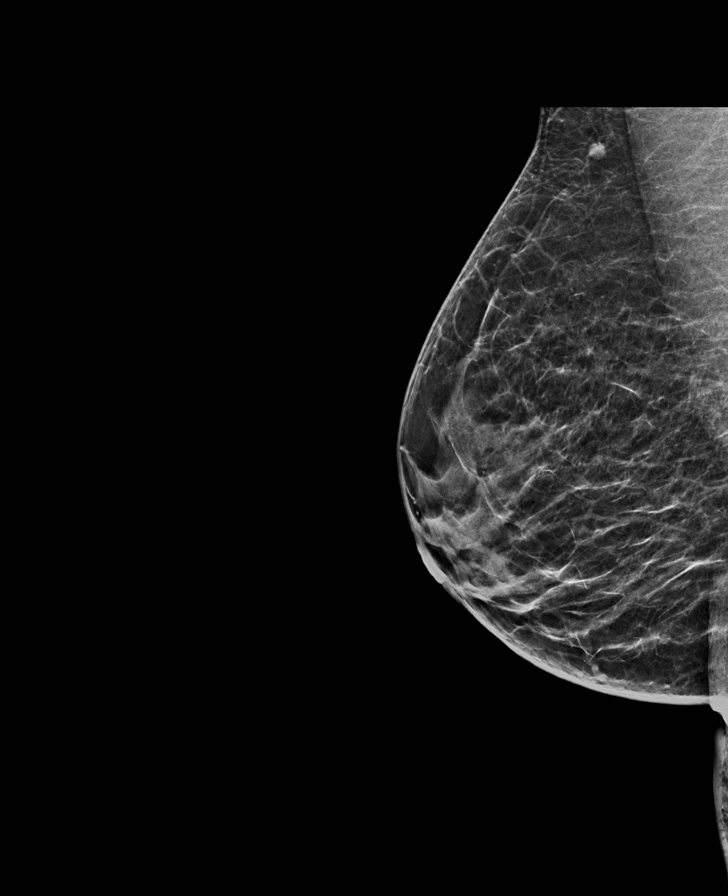

[L CC synth-2D]
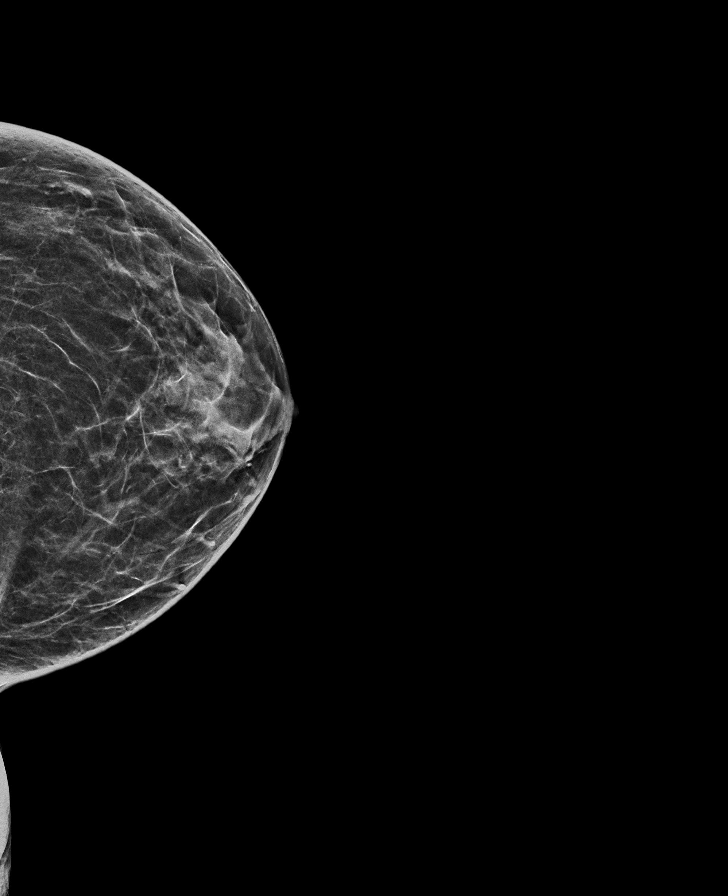

[R CC synth-2D]
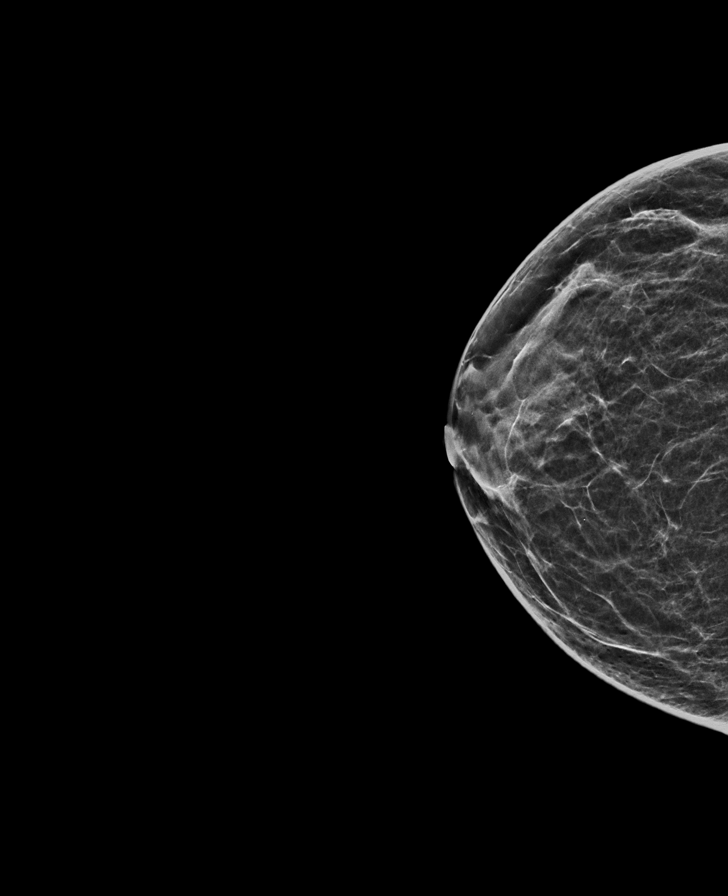

[L MLO tomo · tomo slice 27/52.0]
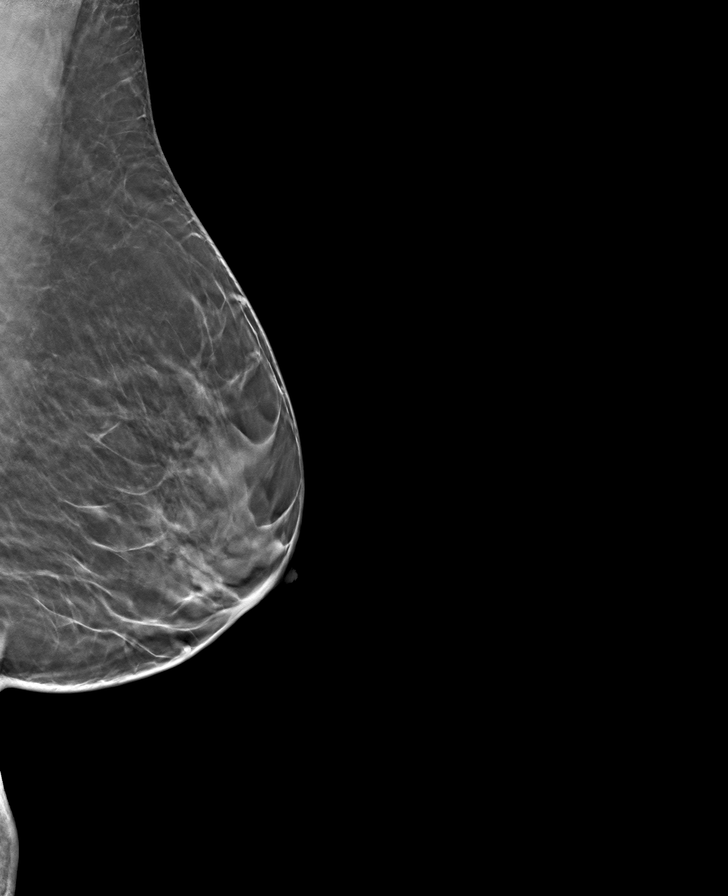

[R MLO tomo · tomo slice 26/51.0]
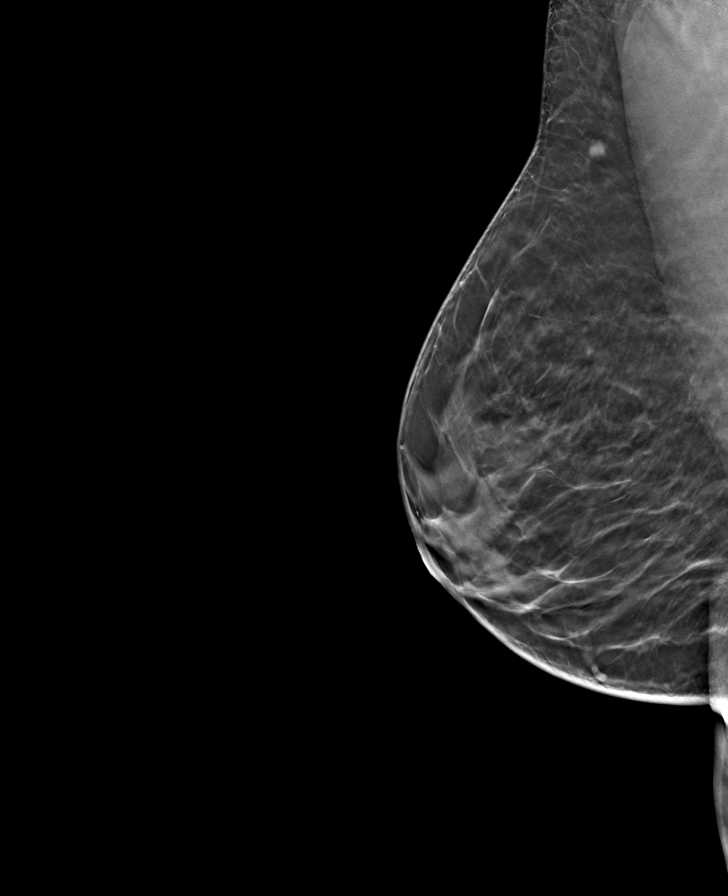

[R CC tomo · tomo slice 25/48.0]
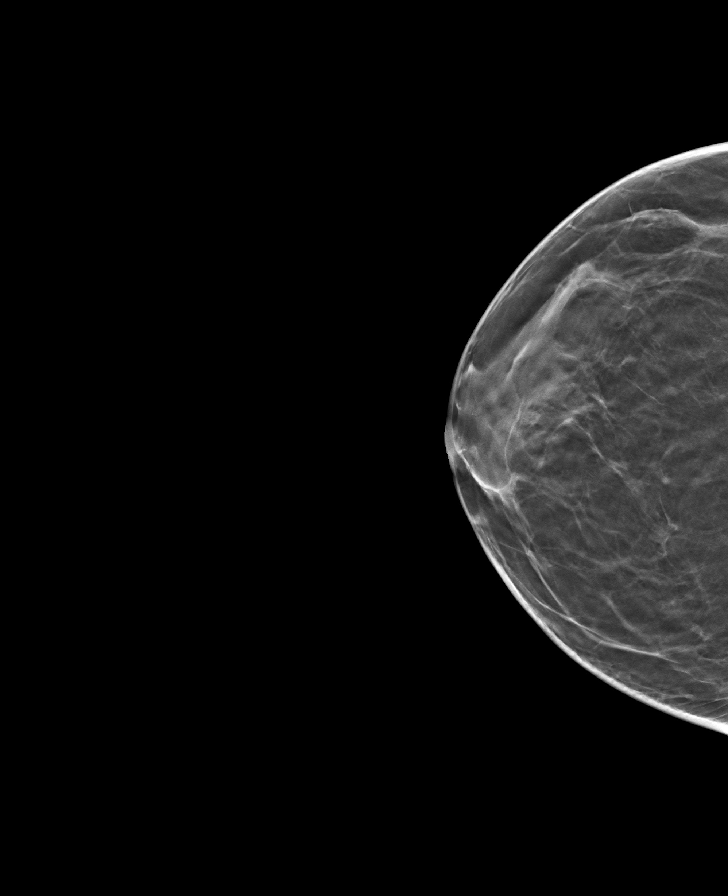

[L CC tomo · tomo slice 26/51.0]
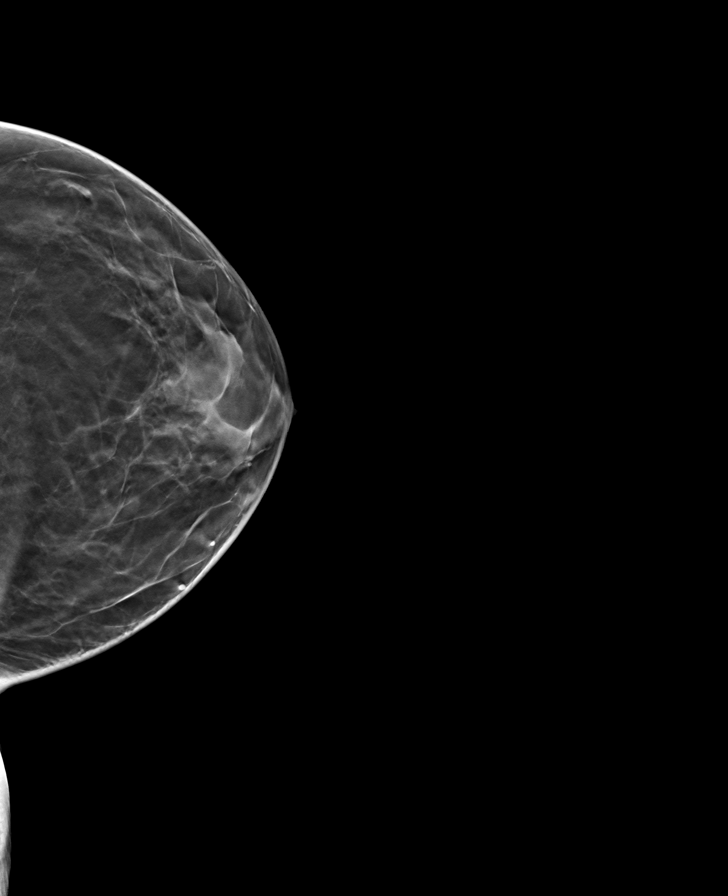

[8 of 24 positions shown; findings below may reference images not displayed]

ACR Breast Density Category b: There are scattered areas of
fibroglandular density.
FINDINGS: There are no findings suspicious for malignancy. Images were
processed with CAD.
IMPRESSION: No mammographic evidence of malignancy. A result letter of this
screening mammogram will be mailed directly to the patient.

RECOMMENDATION:
Screening mammogram in one year. (Code:CN-[id number])

BI-RADS CATEGORY  1: Negative.

## 2020-01-08 ENCOUNTER — Encounter: Payer: Self-pay | Admitting: Internal Medicine

## 2020-01-08 ENCOUNTER — Other Ambulatory Visit: Payer: Self-pay

## 2020-01-08 ENCOUNTER — Ambulatory Visit (INDEPENDENT_AMBULATORY_CARE_PROVIDER_SITE_OTHER): Payer: 59 | Admitting: Internal Medicine

## 2020-01-08 VITALS — BP 128/76 | HR 75 | Temp 98.6°F | Ht 61.0 in | Wt 129.0 lb

## 2020-01-08 DIAGNOSIS — I10 Essential (primary) hypertension: Secondary | ICD-10-CM

## 2020-01-08 DIAGNOSIS — Z1211 Encounter for screening for malignant neoplasm of colon: Secondary | ICD-10-CM

## 2020-01-08 DIAGNOSIS — E782 Mixed hyperlipidemia: Secondary | ICD-10-CM | POA: Diagnosis not present

## 2020-01-08 DIAGNOSIS — E118 Type 2 diabetes mellitus with unspecified complications: Secondary | ICD-10-CM

## 2020-01-08 MED ORDER — ROSUVASTATIN CALCIUM 10 MG PO TABS: 10.0000 mg | ORAL_TABLET | Freq: Every day | ORAL | 3 refills | Status: DC

## 2020-01-08 NOTE — Progress Notes (Signed)
Date:  01/08/2020   Name:  Teresa Rivas   DOB:  [birth year]   MRN:  [id number]   Chief Complaint: Diabetes (4 month follow up.) and Hypertension  Diabetes She presents for her follow-up diabetic visit. She has type 2 diabetes mellitus. Her disease course has been stable. Pertinent negatives for hypoglycemia include no headaches or tremors. Pertinent negatives for diabetes include no chest pain, no fatigue, no polydipsia and no polyuria. Her weight is stable. She is following a generally healthy diet. She monitors blood glucose at home 3-4 x per week. Her breakfast blood glucose is taken between 7-8 am. Her breakfast blood glucose range is generally 110-130 mg/dl. An ACE inhibitor/angiotensin II receptor blocker is being taken. Eye exam is not current.  Hypertension This is a chronic problem. The problem is controlled. Pertinent negatives include no chest pain, headaches, palpitations or shortness of breath.  Hyperlipidemia This is a chronic problem. The problem is uncontrolled. Pertinent negatives include no chest pain or shortness of breath. Current antihyperlipidemic treatment includes statins (zocor 20 mg). The current treatment provides moderate improvement of lipids. There are no compliance problems.     Lab Results  Component Value Date   CREATININE 0.77 09/02/2019   BUN 16 09/02/2019   NA 140 09/02/2019   K 4.8 09/02/2019   CL 102 09/02/2019   CO2 24 09/02/2019   Lab Results  Component Value Date   CHOL 229 (H) 09/02/2019   HDL 78 09/02/2019   LDLCALC 143 (H) 09/02/2019   TRIG 47 09/02/2019   CHOLHDL 2.9 09/02/2019   Lab Results  Component Value Date   TSH 2.070 09/02/2019   Lab Results  Component Value Date   HGBA1C 6.0 (H) 09/02/2019     Review of Systems  Constitutional: Negative for appetite change, fatigue, fever and unexpected weight change.  HENT: Negative for tinnitus and trouble swallowing.   Eyes: Negative for visual disturbance.  Respiratory:  Negative for cough, chest tightness and shortness of breath.   Cardiovascular: Negative for chest pain, palpitations and leg swelling.  Gastrointestinal: Negative for abdominal pain.       Mild intermittent gas and lower abdominal discomfort after taking rybelsus  Endocrine: Negative for polydipsia and polyuria.  Genitourinary: Negative for dysuria and hematuria.  Musculoskeletal: Negative for arthralgias.  Neurological: Negative for tremors, numbness and headaches.  Psychiatric/Behavioral: Negative for dysphoric mood.    Patient Active Problem List   Diagnosis Date Noted  . 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 Type II diabetes mellitus with complication (HCC) 05/22/2017  . Mixed hyperlipidemia 05/22/2017  . Essential hypertension 05/22/2017  . Renal stones 05/22/2017    Allergies  Allergen Reactions  . Codeine     Past Surgical History:  Procedure Laterality Date  . ABDOMINAL HYSTERECTOMY  07/28/2016   total due to fibroids  . TONSILLECTOMY      Social History   Tobacco Use  . Smoking status: Never Smoker  . Smokeless tobacco: Never Used  Substance Use Topics  . Alcohol use: No  . Drug use: No     Medication list has been reviewed and updated.  Current Meds  Medication Sig  . glucose blood (ACCU-CHEK GUIDE) test strip 1 each by Other route as needed for other. Use as instructed  . ibuprofen (ADVIL,MOTRIN) 800 MG tablet Take 1 tablet (800 mg total) by mouth every 8 (eight) hours as needed.  Marland Kitchen lisinopril (ZESTRIL) 5 MG tablet Take 1 tablet (5 mg total) by mouth daily.  Marland Kitchen  metFORMIN (GLUCOPHAGE) 1000 MG tablet Take 1 tablet (1,000 mg total) by mouth 2 (two) times daily with a meal.  . methocarbamol (ROBAXIN) 500 MG tablet Take 1 tablet (500 mg total) by mouth at bedtime.  Marland Kitchen omeprazole (PRILOSEC) 40 MG capsule Take 40 mg by mouth daily.  . RYBELSUS 3 MG TABS Take 1 tablet by mouth once daily  . simvastatin (ZOCOR) 20 MG tablet Take 1 tablet (20 mg total) by  mouth daily.    PHQ 2/9 Scores 01/08/2020 12/25/2019 09/02/2019 05/13/2019  PHQ - 2 Score 0 0 0 0    BP Readings from Last 3 Encounters:  01/08/20 128/76  12/25/19 128/78  09/02/19 124/78    Physical Exam Vitals and nursing note reviewed.  Constitutional:      General: She is not in acute distress.    Appearance: She is well-developed.  HENT:     Head: Normocephalic and atraumatic.  Cardiovascular:     Rate and Rhythm: Normal rate and regular rhythm.  Pulmonary:     Effort: Pulmonary effort is normal. No respiratory distress.     Breath sounds: Normal breath sounds. No wheezing or rhonchi.  Musculoskeletal:     Right lower leg: No edema.     Left lower leg: No edema.  Lymphadenopathy:     Cervical: No cervical adenopathy.  Skin:    General: Skin is warm and dry.     Capillary Refill: Capillary refill takes less than 2 seconds.     Findings: No rash.  Neurological:     Mental Status: She is alert and oriented to person, place, and time.  Psychiatric:        Behavior: Behavior normal.        Thought Content: Thought content normal.     Wt Readings from Last 3 Encounters:  01/08/20 129 lb (58.5 kg)  12/25/19 133 lb (60.3 kg)  09/02/19 128 lb (58.1 kg)    BP 128/76   Pulse 75   Temp 98.6 F (37 C) (Oral)   Ht 5\' 1"  (1.549 m)   Wt 129 lb (58.5 kg)   LMP  (LMP Unknown)   SpO2 100%   BMI 24.37 kg/m   Assessment and Plan: 1. Type II diabetes mellitus with complication (HCC) Clinically stable by exam and report without s/s of hypoglycemia. DM complicated by htn, lipids. Tolerating medications metformin and rybelsus well - only mild intermittent abdominal complaints that are tolerable for now Pt reminded to schedule DM eye exam - Hemoglobin A1c  2. Essential hypertension Clinically stable exam with well controlled BP on lisinopril 5 mg. Tolerating medications without side effects at this time. Pt to continue current regimen and low sodium diet; benefits of  regular exercise as able discussed.  3. Mixed hyperlipidemia Not at goal of LDL < 70 on simvastatin 20 mg.  Pt agreeable to switch to Crestor. Will check lipids at next visit - rosuvastatin (CRESTOR) 10 MG tablet; Take 1 tablet (10 mg total) by mouth daily.  Dispense: 90 tablet; Refill: 3  4. Colon cancer screening Pt declines colonoscopy - will get FIT testing - Fecal occult blood, imunochemical   Partially dictated using Dragon software. Any errors are unintentional.  Halina Maidens, MD [facility]  01/08/2020

## 2020-01-09 LAB — HEMOGLOBIN A1C
Est. average glucose Bld gHb Est-mCnc: 128 mg/dL
Hgb A1c MFr Bld: 6.1 % — ABNORMAL HIGH (ref 4.8–5.6)

## 2020-03-09 ENCOUNTER — Telehealth: Payer: Self-pay | Admitting: Internal Medicine

## 2020-03-09 NOTE — Telephone Encounter (Signed)
Called pt and left VM about stool test. She needed to complete stool test. Asked her if she still had the test or if she needed [place] to leave a new one at the front.  KP

## 2020-04-27 ENCOUNTER — Other Ambulatory Visit: Payer: Self-pay | Admitting: Internal Medicine

## 2020-04-27 ENCOUNTER — Other Ambulatory Visit: Payer: Self-pay

## 2020-04-27 DIAGNOSIS — E119 Type 2 diabetes mellitus without complications: Secondary | ICD-10-CM

## 2020-04-27 NOTE — Telephone Encounter (Signed)
Requested medication (s) are due for refill today -yes  Requested medication (s) are on the active medication list -yes  Future visit scheduled -yes  Last refill: 11/28/19  Notes to clinic: Request RF medication not assigned protocol  Requested Prescriptions  Pending Prescriptions Disp Refills   RYBELSUS 3 MG TABS [Pharmacy Med Name: Rybelsus 3 MG Oral Tablet] 90 tablet 0    Sig: Take 1 tablet by mouth once daily      Off-Protocol Failed - 04/27/2020  2:23 PM      Failed - Medication not assigned to a protocol, review manually.      Passed - Valid encounter within last 12 months    Recent Outpatient Visits           3 months ago Type II diabetes mellitus with complication [facility])   [facility] Glean Hess, MD   4 months ago Dental infection   [facility] Glean Hess, MD   7 months ago Annual physical exam   [facility] Glean Hess, MD   11 months ago Essential hypertension   [facility] Glean Hess, MD   1 year ago Type 2 diabetes mellitus without complication, without long-term current use of insulin [facility])   [facility] Glean Hess, MD       Future Appointments             In 1 week Glean Hess, MD [facility], [place]   In 4 months Glean Hess, MD [facility], [facility]             Signed Prescriptions Disp Refills   metFORMIN (GLUCOPHAGE) 1000 MG tablet 180 tablet 0    Sig: TAKE 1 TABLET BY MOUTH TWICE DAILY WITH A MEAL      Endocrinology:  Diabetes - Biguanides Passed - 04/27/2020  2:23 PM      Passed - Cr in normal range and within 360 days    Creatinine, Ser  Date Value Ref Range Status  09/02/2019 0.77 0.57 - 1.00 mg/dL Final          Passed - HBA1C is between 0 and 7.9 and within 180 days    Hgb A1c MFr Bld  Date Value Ref Range Status  01/08/2020 6.1 (H) 4.8 - 5.6 % Final    Comment:             Prediabetes: 5.7 - 6.4          Diabetes: >6.4           Glycemic control for adults with diabetes: <7.0           Passed - eGFR in normal range and within 360 days    GFR calc Af Amer  Date Value Ref Range Status  09/02/2019 102 >59 mL/min/1.73 Final   GFR calc non Af Amer  Date Value Ref Range Status  09/02/2019 88 >59 mL/min/1.73 Final          Passed - Valid encounter within last 6 months    Recent Outpatient Visits           3 months ago Type II diabetes mellitus with complication [facility])   [facility] Glean Hess, MD   4 months ago Dental infection   [facility] Glean Hess, MD   7 months ago Annual physical exam   [facility] Glean Hess, MD   11 months ago Essential  hypertension   [facility] Glean Hess, MD   1 year ago Type 2 diabetes mellitus without complication, without long-term current use of insulin [facility])   [facility] Glean Hess, MD       Future Appointments             In 1 week Army Melia Jesse Sans, MD [facility], [place]   In 4 months Army Melia Jesse Sans, MD [facility] [place] Prescriptions  Pending Prescriptions Disp Refills   RYBELSUS 3 MG TABS [Pharmacy Med Name: Rybelsus 3 MG Oral Tablet] 90 tablet 0    Sig: Take 1 tablet by mouth once daily      Off-Protocol Failed - 04/27/2020  2:23 PM      Failed - Medication not assigned to a protocol, review manually.      Passed - Valid encounter within last 12 months    Recent Outpatient Visits           3 months ago Type II diabetes mellitus with complication [facility])   [facility], MD   4 months ago Dental infection   [facility] Glean Hess, MD   7 months ago Annual physical exam   [facility] Glean Hess, MD   11 months ago Essential hypertension   [facility] Glean Hess, MD   1 year ago Type 2 diabetes mellitus without complication, without long-term  current use of insulin [facility])   [facility] Glean Hess, MD       Future Appointments             In 1 week Glean Hess, MD [facility], [place]   In 4 months Glean Hess, MD [facility], [facility]             Signed Prescriptions Disp Refills   metFORMIN (GLUCOPHAGE) 1000 MG tablet 180 tablet 0    Sig: TAKE 1 TABLET BY MOUTH TWICE DAILY WITH A MEAL      Endocrinology:  Diabetes - Biguanides Passed - 04/27/2020  2:23 PM      Passed - Cr in normal range and within 360 days    Creatinine, Ser  Date Value Ref Range Status  09/02/2019 0.77 0.57 - 1.00 mg/dL Final          Passed - HBA1C is between 0 and 7.9 and within 180 days    Hgb A1c MFr Bld  Date Value Ref Range Status  01/08/2020 6.1 (H) 4.8 - 5.6 % Final    Comment:             Prediabetes: 5.7 - 6.4          Diabetes: >6.4          Glycemic control for adults with diabetes: <7.0           Passed - eGFR in normal range and within 360 days    GFR calc Af Amer  Date Value Ref Range Status  09/02/2019 102 >59 mL/min/1.73 Final   GFR calc non Af Amer  Date Value Ref Range Status  09/02/2019 88 >59 mL/min/1.73 Final          Passed - Valid encounter within last 6 months    Recent Outpatient Visits           3 months  ago Type II diabetes mellitus with complication [facility])   [facility] Gle[facility], MD   4 months ago Dental infection   [facility] Glean Hess, MD   7 months ago Annual physical exam   [facility] Glean Hess, MD   11 months ago Essential hypertension   [facility] Glean Hess, MD   1 year ago Type 2 diabetes mellitus without complication, without long-term current use of insulin [facility])   [facility] Glean Hess, MD       Future Appointments             In 1 week Army Melia Jesse Sans, MD [facility], [place]   In 4 months Army Melia Jesse Sans, MD [facility], [facility]

## 2020-05-07 ENCOUNTER — Other Ambulatory Visit: Payer: Self-pay

## 2020-05-07 ENCOUNTER — Encounter: Payer: Self-pay | Admitting: Internal Medicine

## 2020-05-07 ENCOUNTER — Ambulatory Visit (INDEPENDENT_AMBULATORY_CARE_PROVIDER_SITE_OTHER): Payer: 59 | Admitting: Internal Medicine

## 2020-05-07 VITALS — BP 126/78 | HR 87 | Temp 98.4°F | Ht 61.0 in | Wt 137.0 lb

## 2020-05-07 DIAGNOSIS — E782 Mixed hyperlipidemia: Secondary | ICD-10-CM

## 2020-05-07 DIAGNOSIS — I10 Essential (primary) hypertension: Secondary | ICD-10-CM

## 2020-05-07 DIAGNOSIS — E118 Type 2 diabetes mellitus with unspecified complications: Secondary | ICD-10-CM | POA: Diagnosis not present

## 2020-05-07 NOTE — Progress Notes (Signed)
Date:  05/07/2020   Name:  Teresa Rivas   DOB:  [birth year]   MRN:  [id number]   Chief Complaint: Diabetes (4 month follow up.) and Hyperlipidemia (Fasting for Lipid labs. )  Diabetes She presents for her follow-up diabetic visit. She has type 2 diabetes mellitus. Her disease course has been stable. Pertinent negatives for hypoglycemia include no headaches or tremors. Pertinent negatives for diabetes include no chest pain, no fatigue, no polydipsia and no polyuria. Current diabetic treatment includes oral agent (dual therapy). She is compliant with treatment all of the time. She monitors blood glucose at home 1-2 x per day. Her breakfast blood glucose is taken between 7-8 am. Her breakfast blood glucose range is generally 110-130 mg/dl. An ACE inhibitor/angiotensin II receptor blocker is being taken.  Hyperlipidemia This is a chronic problem. The problem is controlled. Pertinent negatives include no chest pain or shortness of breath. Current antihyperlipidemic treatment includes statins (changed meds to more intense statin last visit).    Lab Results  Component Value Date   CREATININE 0.77 09/02/2019   BUN 16 09/02/2019   NA 140 09/02/2019   K 4.8 09/02/2019   CL 102 09/02/2019   CO2 24 09/02/2019   Lab Results  Component Value Date   CHOL 229 (H) 09/02/2019   HDL 78 09/02/2019   LDLCALC 143 (H) 09/02/2019   TRIG 47 09/02/2019   CHOLHDL 2.9 09/02/2019   Lab Results  Component Value Date   TSH 2.070 09/02/2019   Lab Results  Component Value Date   HGBA1C 6.1 (H) 01/08/2020   Lab Results  Component Value Date   WBC 6.3 09/02/2019   HGB 12.9 09/02/2019   HCT 39.3 09/02/2019   MCV 80 09/02/2019   PLT 271 09/02/2019   Lab Results  Component Value Date   ALT 11 09/02/2019   AST 20 09/02/2019   ALKPHOS 91 09/02/2019   BILITOT 0.3 09/02/2019     Review of Systems  Constitutional: Negative for appetite change, fatigue, fever and unexpected weight change.  HENT:  Negative for tinnitus and trouble swallowing.   Eyes: Negative for visual disturbance.  Respiratory: Negative for cough, chest tightness and shortness of breath.   Cardiovascular: Negative for chest pain, palpitations and leg swelling.  Gastrointestinal: Negative for abdominal pain.  Endocrine: Negative for polydipsia and polyuria.  Genitourinary: Negative for dysuria and hematuria.  Musculoskeletal: Negative for arthralgias.  Neurological: Negative for tremors, numbness and headaches.  Psychiatric/Behavioral: Negative for dysphoric mood.    Patient Active Problem List   Diagnosis Date Noted  . 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 Type II diabetes mellitus with complication ([place]) 58/85/0277  . Mixed hyperlipidemia 05/22/2017  . Essential hypertension 05/22/2017  . Renal stones 05/22/2017    Allergies  Allergen Reactions  . Codeine     Past Surgical History:  Procedure Laterality Date  . ABDOMINAL HYSTERECTOMY  07/28/2016   total due to fibroids  . TONSILLECTOMY      Social History   Tobacco Use  . Smoking status: Never Smoker  . Smokeless tobacco: Never Used  Vaping Use  . Vaping Use: Never used  Substance Use Topics  . Alcohol use: No  . Drug use: No     Medication list has been reviewed and updated.  Current Meds  Medication Sig  . glucose blood (ACCU-CHEK GUIDE) test strip 1 each by Other route as needed for other. Use as instructed  . ibuprofen (ADVIL,MOTRIN) 800 MG tablet Take 1 tablet (  800 mg total) by mouth every 8 (eight) hours as needed.  Marland Kitchen lisinopril (ZESTRIL) 5 MG tablet Take 1 tablet (5 mg total) by mouth daily.  . metFORMIN (GLUCOPHAGE) 1000 MG tablet TAKE 1 TABLET BY MOUTH TWICE DAILY WITH A MEAL  . methocarbamol (ROBAXIN) 500 MG tablet Take 1 tablet (500 mg total) by mouth at bedtime.  Marland Kitchen omeprazole (PRILOSEC) 40 MG capsule Take 40 mg by mouth daily.  . rosuvastatin (CRESTOR) 10 MG tablet Take 1 tablet (10 mg total) by mouth daily.  .  RYBELSUS 3 MG TABS Take 1 tablet by mouth once daily    PHQ 2/9 Scores 05/07/2020 01/08/2020 12/25/2019 09/02/2019  PHQ - 2 Score 0 0 0 0  PHQ- 9 Score 0 - - -    BP Readings from Last 3 Encounters:  05/07/20 126/78  01/08/20 128/76  12/25/19 128/78    Physical Exam Vitals and nursing note reviewed.  Constitutional:      General: She is not in acute distress.    Appearance: She is well-developed.  HENT:     Head: Normocephalic and atraumatic.  Pulmonary:     Effort: Pulmonary effort is normal. No respiratory distress.  Musculoskeletal:        General: Normal range of motion.  Skin:    General: Skin is warm and dry.     Findings: No rash.  Neurological:     Mental Status: She is alert and oriented to person, place, and time.  Psychiatric:        Behavior: Behavior normal.        Thought Content: Thought content normal.     Wt Readings from Last 3 Encounters:  05/07/20 137 lb (62.1 kg)  01/08/20 129 lb (58.5 kg)  12/25/19 133 lb (60.3 kg)    BP 126/78 (BP Location: Left Arm, Patient Position: Sitting, Cuff Size: Normal)   Pulse 87   Temp 98.4 F (36.9 C) (Oral)   Ht 5\' 1"  (1.549 m)   Wt 137 lb (62.1 kg)   LMP  (LMP Unknown)   SpO2 100%   BMI 25.89 kg/m   Assessment and Plan: 1. Type II diabetes mellitus with complication (HCC) Clinically stable by exam and report without s/s of hypoglycemia. DM complicated by lipids. Tolerating medications metformin and Ryblesus well without side effects or other concerns. - Comprehensive metabolic panel - Hemoglobin A1c  2. Essential hypertension Clinically stable exam with well controlled BP on lisinopril. Tolerating medications without side effects at this time. Pt to continue current regimen and low sodium diet; benefits of regular exercise as able discussed.  3. Mixed hyperlipidemia Now on high intensity statin and tolerating with no side effects - Comprehensive metabolic panel - Lipid panel   Partially dictated  using Dragon software. Any errors are unintentional.  , MD [facility]  05/07/2020

## 2020-05-08 LAB — COMPREHENSIVE METABOLIC PANEL
ALT: 18 IU/L (ref 0–32)
AST: 21 IU/L (ref 0–40)
Albumin/Globulin Ratio: 1.9 (ref 1.2–2.2)
Albumin: 4.7 g/dL (ref 3.8–4.9)
Alkaline Phosphatase: 86 IU/L (ref 48–121)
BUN/Creatinine Ratio: 19 (ref 9–23)
BUN: 14 mg/dL (ref 6–24)
Bilirubin Total: 0.3 mg/dL (ref 0.0–1.2)
CO2: 24 mmol/L (ref 20–29)
Calcium: 10.2 mg/dL (ref 8.7–10.2)
Chloride: 104 mmol/L (ref 96–106)
Creatinine, Ser: 0.73 mg/dL (ref 0.57–1.00)
GFR calc Af Amer: 108 mL/min/{1.73_m2} (ref >59)
GFR calc non Af Amer: 94 mL/min/{1.73_m2} (ref >59)
Globulin, Total: 2.5 g/dL (ref 1.5–4.5)
Glucose: 118 mg/dL — ABNORMAL HIGH (ref 65–99)
Potassium: 4.1 mmol/L (ref 3.5–5.2)
Sodium: 142 mmol/L (ref 134–144)
Total Protein: 7.2 g/dL (ref 6.0–8.5)

## 2020-05-08 LAB — LIPID PANEL
Chol/HDL Ratio: 2 ratio (ref 0.0–4.4)
Cholesterol, Total: 175 mg/dL (ref 100–199)
HDL: 89 mg/dL (ref >39)
LDL Chol Calc (NIH): 78 mg/dL (ref 0–99)
Triglycerides: 35 mg/dL (ref 0–149)
VLDL Cholesterol Cal: 8 mg/dL (ref 5–40)

## 2020-05-08 LAB — HEMOGLOBIN A1C
Est. average glucose Bld gHb Est-mCnc: 134 mg/dL
Hgb A1c MFr Bld: 6.3 % — ABNORMAL HIGH (ref 4.8–5.6)

## 2020-07-08 ENCOUNTER — Telehealth: Payer: Self-pay

## 2020-07-08 NOTE — Telephone Encounter (Signed)
Pt needs PA for Rybelsus. I started PA needed to call to get the PA handled. Called Help desk on pts insurance card. Was routed to someone and they said that I had to contact [name]. I called [name] today. When I called she was on the phone. They took my name and number and will give me a call back.  KP

## 2020-07-08 NOTE — Telephone Encounter (Signed)
Spoke with [name] she let me know that her Rybelsus was denied for a 90 days supply because her insurance only allows a certain amount of money and with a 90 supply pts insurance would be over the amount. She stated that pt was only approved for a 30 day supply.  KP

## 2020-09-05 DIAGNOSIS — Z90722 Acquired absence of ovaries, bilateral: Secondary | ICD-10-CM | POA: Insufficient documentation

## 2020-09-05 DIAGNOSIS — Z90710 Acquired absence of both cervix and uterus: Secondary | ICD-10-CM | POA: Insufficient documentation

## 2020-09-05 NOTE — Progress Notes (Signed)
Date:  09/06/2020   Name:  Teresa Rivas   DOB:  01-09-1966   MRN:  [id number]   Chief Complaint: Annual Exam (Breast Exam and pap smear. )  Teresa Rivas is a 54 y.o. female who presents today for her Complete Annual Exam. She feels well. She reports exercising - walking every day. She reports she is sleeping well. Breast complaints - none.  Mammogram: 05/2019 DEXA: not due Pap smear: 09/2016 discontinued Colonoscopy: seeing Dr. Norma Fredrickson in January  Immunization History  Administered Date(s) Administered  . Influenza,inj,Quad PF,6+ Mos 09/02/2019  . Influenza-Unspecified 09/02/2019  . Moderna SARS-COVID-2 Vaccination 07/15/2020, 08/12/2020  . Pneumococcal Conjugate-13 01/29/2014  Declines flu vaccine Declines Shingles vaccine  Hypertension This is a chronic problem. The problem is controlled. Pertinent negatives include no chest pain, headaches, palpitations or shortness of breath. Past treatments include ACE inhibitors. The current treatment provides significant improvement. There are no compliance problems.   Diabetes She presents for her follow-up diabetic visit. She has type 2 diabetes mellitus. Her disease course has been stable. Pertinent negatives for hypoglycemia include no dizziness, headaches, nervousness/anxiousness or tremors. Pertinent negatives for diabetes include no chest pain, no fatigue, no polydipsia and no polyuria. Symptoms are stable. Current diabetic treatment includes oral agent (dual therapy) (metformin and rybelsus 3 mg). She is compliant with treatment all of the time. Her weight is stable. An ACE inhibitor/angiotensin II receptor blocker is being taken. She does not see a podiatrist.Eye exam is not current.  Hyperlipidemia This is a chronic problem. The problem is controlled. Pertinent negatives include no chest pain or shortness of breath. Current antihyperlipidemic treatment includes statins. The current treatment provides significant improvement of  lipids. There are no compliance problems.     Lab Results  Component Value Date   CREATININE 0.73 05/07/2020   BUN 14 05/07/2020   NA 142 05/07/2020   K 4.1 05/07/2020   CL 104 05/07/2020   CO2 24 05/07/2020   Lab Results  Component Value Date   CHOL 175 05/07/2020   HDL 89 05/07/2020   LDLCALC 78 05/07/2020   TRIG 35 05/07/2020   CHOLHDL 2.0 05/07/2020   Lab Results  Component Value Date   TSH 2.070 09/02/2019   Lab Results  Component Value Date   HGBA1C 6.3 (H) 05/07/2020   Lab Results  Component Value Date   WBC 6.3 09/02/2019   HGB 12.9 09/02/2019   HCT 39.3 09/02/2019   MCV 80 09/02/2019   PLT 271 09/02/2019   Lab Results  Component Value Date   ALT 18 05/07/2020   AST 21 05/07/2020   ALKPHOS 86 05/07/2020   BILITOT 0.3 05/07/2020     Review of Systems  Constitutional: Negative for chills, fatigue and fever.  HENT: Negative for congestion, hearing loss, tinnitus, trouble swallowing and voice change.   Eyes: Negative for visual disturbance.  Respiratory: Negative for cough, chest tightness, shortness of breath and wheezing.   Cardiovascular: Negative for chest pain, palpitations and leg swelling.  Gastrointestinal: Negative for abdominal pain, constipation, diarrhea and vomiting.  Endocrine: Negative for polydipsia and polyuria.  Genitourinary: Negative for dysuria, frequency, genital sores, vaginal bleeding and vaginal discharge.  Musculoskeletal: Negative for arthralgias, gait problem and joint swelling.  Skin: Negative for color change and rash.  Neurological: Negative for dizziness, tremors, light-headedness and headaches.  Hematological: Negative for adenopathy. Does not bruise/bleed easily.  Psychiatric/Behavioral: Negative for dysphoric mood and sleep disturbance. The patient is not nervous/anxious.     Patient  Active Problem List   Diagnosis Date Noted  . S/P total hysterectomy and bilateral salpingo-oophorectomy 09/05/2020  . Epicondylitis,  lateral, right 09/26/2017  . Type II diabetes mellitus with complication (HCC) 05/22/2017  . Mixed hyperlipidemia 05/22/2017  . Essential hypertension 05/22/2017  . Renal stones 05/22/2017    Allergies  Allergen Reactions  . Codeine     Past Surgical History:  Procedure Laterality Date  . ABDOMINAL HYSTERECTOMY  07/28/2016   total due to fibroids  . TONSILLECTOMY      Social History   Tobacco Use  . Smoking status: Never Smoker  . Smokeless tobacco: Never Used  Vaping Use  . Vaping Use: Never used  Substance Use Topics  . Alcohol use: No  . Drug use: No     Medication list has been reviewed and updated.  Current Meds  Medication Sig  . glucose blood (ACCU-CHEK GUIDE) test strip 1 each by Other route as needed for other. Use as instructed  . ibuprofen (ADVIL,MOTRIN) 800 MG tablet Take 1 tablet (800 mg total) by mouth every 8 (eight) hours as needed.  Marland Kitchen lisinopril (ZESTRIL) 5 MG tablet Take 1 tablet (5 mg total) by mouth daily.  . metFORMIN (GLUCOPHAGE) 1000 MG tablet TAKE 1 TABLET BY MOUTH TWICE DAILY WITH A MEAL  . methocarbamol (ROBAXIN) 500 MG tablet Take 1 tablet (500 mg total) by mouth at bedtime.  Marland Kitchen omeprazole (PRILOSEC) 40 MG capsule Take 40 mg by mouth daily.  . rosuvastatin (CRESTOR) 10 MG tablet Take 1 tablet (10 mg total) by mouth daily.  . RYBELSUS 3 MG TABS Take 1 tablet by mouth once daily    PHQ 2/9 Scores 09/06/2020 05/07/2020 01/08/2020 12/25/2019  PHQ - 2 Score 0 0 0 0  PHQ- 9 Score 0 0 - -    GAD 7 : Generalized Anxiety Score 05/07/2020  Nervous, Anxious, on Edge 0  Control/stop worrying 0  Worry too much - different things 0  Trouble relaxing 0  Restless 0  Easily annoyed or irritable 0  Afraid - awful might happen 0  Total GAD 7 Score 0  Anxiety Difficulty Not difficult at all    BP Readings from Last 3 Encounters:  09/06/20 130/80  05/07/20 126/78  01/08/20 128/76    Physical Exam Vitals and nursing note reviewed.    Constitutional:      General: She is not in acute distress.    Appearance: She is well-developed.  HENT:     Head: Normocephalic and atraumatic.     Right Ear: Tympanic membrane and ear canal normal.     Left Ear: Tympanic membrane and ear canal normal.     Nose:     Right Sinus: No maxillary sinus tenderness.     Left Sinus: No maxillary sinus tenderness.  Eyes:     General: No scleral icterus.       Right eye: No discharge.        Left eye: No discharge.     Conjunctiva/sclera: Conjunctivae normal.  Neck:     Thyroid: No thyromegaly.     Vascular: No carotid bruit.  Cardiovascular:     Rate and Rhythm: Normal rate and regular rhythm.     Pulses: Normal pulses.     Heart sounds: Normal heart sounds.  Pulmonary:     Effort: Pulmonary effort is normal. No respiratory distress.     Breath sounds: No wheezing.  Chest:     Breasts:  Right: No mass, nipple discharge, skin change or tenderness.        Left: No mass, nipple discharge, skin change or tenderness.  Abdominal:     General: Bowel sounds are normal.     Palpations: Abdomen is soft.     Tenderness: There is no abdominal tenderness.  Genitourinary:    Labia:        Right: No tenderness, lesion or injury.        Left: No tenderness, lesion or injury.      Vagina: Normal.     Uterus: Absent.      Adnexa: Right adnexa normal and left adnexa normal.     Comments: Pap obtained from the vaginal cuff Musculoskeletal:        General: Normal range of motion.     Cervical back: Normal range of motion. No erythema.     Right lower leg: No edema.     Left lower leg: No edema.  Lymphadenopathy:     Cervical: No cervical adenopathy.  Skin:    General: Skin is warm and dry.     Findings: No rash.  Neurological:     Mental Status: She is alert and oriented to person, place, and time.     Cranial Nerves: No cranial nerve deficit.     Sensory: No sensory deficit.     Deep Tendon Reflexes: Reflexes are normal and  symmetric.  Psychiatric:        Attention and Perception: Attention normal.        Mood and Affect: Mood normal.     Wt Readings from Last 3 Encounters:  09/06/20 134 lb 9.6 oz (61.1 kg)  05/07/20 137 lb (62.1 kg)  01/08/20 129 lb (58.5 kg)    BP 130/80   Pulse 81   Temp 98.6 F (37 C) (Oral)   Ht 5\' 1"  (1.549 m)   Wt 134 lb 9.6 oz (61.1 kg)   LMP  (LMP Unknown)   SpO2 99%   BMI 25.43 kg/m   Assessment and Plan: 1. Annual physical exam Normal exam Continue healthy diet and exercise - POCT urinalysis dipstick - VITAMIN D 25 Hydroxy (Vit-D Deficiency, Fractures)  2. Encounter for screening mammogram for breast cancer To be scheduled at [facility] - MM 3D SCREEN BREAST BILATERAL; Future  3. Colon cancer screening Has appt in Jan with GI  4. Need for hepatitis C screening test - Hepatitis C antibody  5. Essential hypertension Clinically stable exam with well controlled BP on lisinopril. Tolerating medications without side effects at this time. Pt to continue current regimen  - CBC with Differential/Platelet - TSH  6. Type II diabetes mellitus with complication (HCC) Clinically stable by exam and report without s/s of hypoglycemia. DM complicated by htn. Tolerating medications Rybelsus and metformin well without side effects or other concerns. - Comprehensive metabolic panel - Hemoglobin A1c  7. Mixed hyperlipidemia On statin therapy - Lipid panel  8. Encounter for gynecological examination without abnormal finding Pap obtained today No further pap smears needed - Cytology - PAP   Partially dictated using Dragon software. Any errors are unintentional.  Feb, MD [facility]  09/06/2020

## 2020-09-06 ENCOUNTER — Other Ambulatory Visit: Payer: Self-pay

## 2020-09-06 ENCOUNTER — Encounter: Payer: Self-pay | Admitting: Internal Medicine

## 2020-09-06 ENCOUNTER — Other Ambulatory Visit (HOSPITAL_COMMUNITY)
Admission: RE | Admit: 2020-09-06 | Discharge: 2020-09-06 | Disposition: A | Discharge status: Home / Self Care | Payer: 59 | Source: Ambulatory Visit | Attending: Internal Medicine | Admitting: Internal Medicine

## 2020-09-06 ENCOUNTER — Other Ambulatory Visit
Admission: RE | Admit: 2020-09-06 | Discharge: 2020-09-06 | Disposition: A | Discharge status: Home / Self Care | Payer: 59 | Attending: Internal Medicine | Admitting: Internal Medicine

## 2020-09-06 ENCOUNTER — Ambulatory Visit (INDEPENDENT_AMBULATORY_CARE_PROVIDER_SITE_OTHER): Payer: 59 | Admitting: Internal Medicine

## 2020-09-06 VITALS — BP 130/80 | HR 81 | Temp 98.6°F | Ht 61.0 in | Wt 134.6 lb

## 2020-09-06 DIAGNOSIS — Z01419 Encounter for gynecological examination (general) (routine) without abnormal findings: Secondary | ICD-10-CM | POA: Diagnosis not present

## 2020-09-06 DIAGNOSIS — I10 Essential (primary) hypertension: Secondary | ICD-10-CM | POA: Diagnosis not present

## 2020-09-06 DIAGNOSIS — E782 Mixed hyperlipidemia: Secondary | ICD-10-CM

## 2020-09-06 DIAGNOSIS — Z1231 Encounter for screening mammogram for malignant neoplasm of breast: Secondary | ICD-10-CM | POA: Diagnosis not present

## 2020-09-06 DIAGNOSIS — E118 Type 2 diabetes mellitus with unspecified complications: Secondary | ICD-10-CM | POA: Diagnosis not present

## 2020-09-06 DIAGNOSIS — Z1159 Encounter for screening for other viral diseases: Secondary | ICD-10-CM

## 2020-09-06 DIAGNOSIS — Z0000 Encounter for general adult medical examination without abnormal findings: Secondary | ICD-10-CM | POA: Diagnosis not present

## 2020-09-06 DIAGNOSIS — Z1211 Encounter for screening for malignant neoplasm of colon: Secondary | ICD-10-CM | POA: Diagnosis not present

## 2020-09-06 LAB — POCT URINALYSIS DIPSTICK
Bilirubin, UA: NEGATIVE
Glucose, UA: NEGATIVE
Ketones, UA: NEGATIVE
Leukocytes, UA: NEGATIVE
Nitrite, UA: NEGATIVE
Protein, UA: NEGATIVE
Spec Grav, UA: 1.015 (ref 1.010–1.025)
Urobilinogen, UA: 0.2 E.U./dL
pH, UA: 6.5 (ref 5.0–8.0)

## 2020-09-06 LAB — CBC WITH DIFFERENTIAL/PLATELET
Abs Immature Granulocytes: 0.01 10*3/uL (ref 0.00–0.07)
Basophils Absolute: 0 10*3/uL (ref 0.0–0.1)
Basophils Relative: 1 %
Eosinophils Absolute: 0.1 10*3/uL (ref 0.0–0.5)
Eosinophils Relative: 2 %
HCT: 38.1 % (ref 36.0–46.0)
Hemoglobin: 11.7 g/dL — ABNORMAL LOW (ref 12.0–15.0)
Immature Granulocytes: 0 %
Lymphocytes Relative: 35 %
Lymphs Abs: 1.7 10*3/uL (ref 0.7–4.0)
MCH: 25.9 pg — ABNORMAL LOW (ref 26.0–34.0)
MCHC: 30.7 g/dL (ref 30.0–36.0)
MCV: 84.5 fL (ref 80.0–100.0)
Monocytes Absolute: 0.3 10*3/uL (ref 0.1–1.0)
Monocytes Relative: 6 %
Neutro Abs: 2.9 10*3/uL (ref 1.7–7.7)
Neutrophils Relative %: 56 %
Platelets: 232 10*3/uL (ref 150–400)
RBC: 4.51 MIL/uL (ref 3.87–5.11)
RDW: 13.1 % (ref 11.5–15.5)
WBC: 5 10*3/uL (ref 4.0–10.5)
nRBC: 0 % (ref 0.0–0.2)

## 2020-09-06 LAB — COMPREHENSIVE METABOLIC PANEL
ALT: 14 U/L (ref 0–44)
AST: 19 U/L (ref 15–41)
Albumin: 4.2 g/dL (ref 3.5–5.0)
Alkaline Phosphatase: 61 U/L (ref 38–126)
Anion gap: 6 (ref 5–15)
BUN: 18 mg/dL (ref 6–20)
CO2: 30 mmol/L (ref 22–32)
Calcium: 9.3 mg/dL (ref 8.9–10.3)
Chloride: 104 mmol/L (ref 98–111)
Creatinine, Ser: 0.68 mg/dL (ref 0.44–1.00)
GFR, Estimated: >60 mL/min (ref >60)
Glucose, Bld: 97 mg/dL (ref 70–99)
Potassium: 4.2 mmol/L (ref 3.5–5.1)
Sodium: 140 mmol/L (ref 135–145)
Total Bilirubin: 0.5 mg/dL (ref 0.3–1.2)
Total Protein: 7.2 g/dL (ref 6.5–8.1)

## 2020-09-06 LAB — LIPID PANEL
Cholesterol: 148 mg/dL (ref 0–200)
HDL: 64 mg/dL (ref >40)
LDL Cholesterol: 79 mg/dL (ref 0–99)
Total CHOL/HDL Ratio: 2.3 RATIO
Triglycerides: 24 mg/dL (ref <150)
VLDL: 5 mg/dL (ref 0–40)

## 2020-09-06 LAB — TSH: TSH: 1.692 u[IU]/mL (ref 0.350–4.500)

## 2020-09-06 LAB — HEPATITIS C ANTIBODY: HCV Ab: NONREACTIVE

## 2020-09-06 LAB — VITAMIN D 25 HYDROXY (VIT D DEFICIENCY, FRACTURES): Vit D, 25-Hydroxy: 22.49 ng/mL — ABNORMAL LOW (ref 30–100)

## 2020-09-06 NOTE — Patient Instructions (Signed)
Schedule your annual diabetic eye exam if not already done  Schedule mammogram here in [place] (number on card)

## 2020-09-07 LAB — CYTOLOGY - PAP: Diagnosis: NEGATIVE

## 2020-09-07 LAB — HEMOGLOBIN A1C
Hgb A1c MFr Bld: 6.1 % — ABNORMAL HIGH (ref 4.8–5.6)
Mean Plasma Glucose: 128.37 mg/dL

## 2020-09-28 ENCOUNTER — Encounter: Payer: Self-pay | Admitting: Internal Medicine

## 2020-12-09 ENCOUNTER — Other Ambulatory Visit: Payer: Self-pay | Admitting: Internal Medicine

## 2020-12-09 DIAGNOSIS — E119 Type 2 diabetes mellitus without complications: Secondary | ICD-10-CM

## 2020-12-09 NOTE — Telephone Encounter (Signed)
Requested medication (s) are due for refill today: yes  Requested medication (s) are on the active medication list: yes  Last refill:  09/01/2020  Future visit scheduled: yes  Notes to clinic:  Medication not assigned to a protocol, review manually   Requested Prescriptions  Pending Prescriptions Disp Refills   RYBELSUS 3 MG TABS [Pharmacy Med Name: Rybelsus 3 MG Oral Tablet] 90 tablet 0    Sig: Take 1 tablet by mouth once daily      Off-Protocol Failed - 12/09/2020  4:00 PM      Failed - Medication not assigned to a protocol, review manually.      Passed - Valid encounter within last 12 months    Recent Outpatient Visits           3 months ago Annual physical exam   [facility] Glean Hess, MD   7 months ago Type II diabetes mellitus with complication [facility])   [facility] Glean Hess, MD   11 months ago Type II diabetes mellitus with complication [facility])   [facility] Glean Hess, MD   11 months ago Dental infection   [facility] Glean Hess, MD   1 year ago Annual physical exam   [facility] Glean Hess, MD       Future Appointments             In 1 month Army Melia Jesse Sans, MD [facility], [place]   In 9 months Glean Hess, MD [facility]              Signed Prescriptions Disp Refills   metFORMIN (GLUCOPHAGE) 1000 MG tablet 180 tablet 0    Sig: TAKE 1 TABLET BY MOUTH TWICE DAILY WITH A MEAL      Endocrinology:  Diabetes - Biguanides Passed - 12/09/2020  4:00 PM      Passed - Cr in normal range and within 360 days    Creatinine, Ser  Date Value Ref Range Status  09/06/2020 0.68 0.44 - 1.00 mg/dL Final          Passed - HBA1C is between 0 and 7.9 and within 180 days    Hgb A1c MFr Bld  Date Value Ref Range Status  09/06/2020 6.1 (H) 4.8 - 5.6 % Final    Comment:    (NOTE) Pre diabetes:          5.7%-6.4%  Diabetes:              >6.4%  Glycemic  control for   <7.0% adults with diabetes           Passed - AA eGFR in normal range and within 360 days    GFR calc Af Amer  Date Value Ref Range Status  05/07/2020 108 >59 mL/min/1.73 Final    Comment:    **[facility] currently reports eGFR in compliance with the current**   recommendations of the [employer]. [facility] will   update reporting as new guidelines are published from the NKF-ASN   Task force.    GFR, Estimated  Date Value Ref Range Status  09/06/2020 >60 >60 mL/min Final          Passed - Valid encounter within last 6 months    Recent Outpatient Visits           3 months ago Annual physical exam   [facility] Glean Hess, MD  7 months ago Type II diabetes mellitus with complication [facility])   [facility] Glean Hess, MD   11 months ago Type II diabetes mellitus with complication [facility])   [facility] Glean Hess, MD   11 months ago Dental infection   [facility] Glean Hess, MD   1 year ago Annual physical exam   [facility] Glean Hess, MD       Future Appointments             In 1 month Army Melia Jesse Sans, MD [facility], [place]   In 9 months Army Melia, Jesse Sans, MD [facility], [facility]

## 2020-12-10 NOTE — Telephone Encounter (Signed)
Please review.  KP

## 2020-12-14 ENCOUNTER — Telehealth: Payer: Self-pay

## 2020-12-14 NOTE — Telephone Encounter (Signed)
PA completed waiting for approval from insurance.  (Key: [id number])   KP

## 2020-12-21 ENCOUNTER — Encounter: Payer: Self-pay | Admitting: Internal Medicine

## 2020-12-22 NOTE — Telephone Encounter (Signed)
Sent in another PA waiting for insurance approval.   (Key: [id number])  KP

## 2020-12-27 NOTE — Telephone Encounter (Signed)
Spoke insurance they let me know that her Rybelsus was denied for a 90 days supply because her insurance only allows a certain amount of money and with a 90 supply pts insurance would be over the amount. She stated that pt was only approved for a 30 day supply. Insurance will try to override the amount they can cover and will reach out to the pharmacy to be able to cover a 90 days supply. Called pt let her know that she should be able to get a 90 day supply now if she has any problems to give [place] a call.    KP

## 2021-01-13 ENCOUNTER — Ambulatory Visit (INDEPENDENT_AMBULATORY_CARE_PROVIDER_SITE_OTHER): Payer: 59 | Admitting: Internal Medicine

## 2021-01-13 ENCOUNTER — Other Ambulatory Visit: Payer: Self-pay

## 2021-01-13 ENCOUNTER — Encounter: Payer: Self-pay | Admitting: Internal Medicine

## 2021-01-13 VITALS — BP 132/82 | HR 85 | Ht 61.0 in | Wt 140.0 lb

## 2021-01-13 DIAGNOSIS — I10 Essential (primary) hypertension: Secondary | ICD-10-CM

## 2021-01-13 DIAGNOSIS — M79645 Pain in left finger(s): Secondary | ICD-10-CM | POA: Diagnosis not present

## 2021-01-13 DIAGNOSIS — E782 Mixed hyperlipidemia: Secondary | ICD-10-CM | POA: Diagnosis not present

## 2021-01-13 DIAGNOSIS — E118 Type 2 diabetes mellitus with unspecified complications: Secondary | ICD-10-CM

## 2021-01-13 DIAGNOSIS — M62830 Muscle spasm of back: Secondary | ICD-10-CM

## 2021-01-13 LAB — POCT GLYCOSYLATED HEMOGLOBIN (HGB A1C): Hemoglobin A1C: 6.6 % — AB (ref 4.0–5.6)

## 2021-01-13 MED ORDER — LISINOPRIL 5 MG PO TABS: 5.0000 mg | ORAL_TABLET | Freq: Every day | ORAL | 3 refills | Status: DC

## 2021-01-13 MED ORDER — RYBELSUS 3 MG PO TABS: 1.0000 | ORAL_TABLET | Freq: Every day | ORAL | 5 refills | Status: DC

## 2021-01-13 MED ORDER — ROSUVASTATIN CALCIUM 10 MG PO TABS: 10.0000 mg | ORAL_TABLET | Freq: Every day | ORAL | 3 refills | Status: DC

## 2021-01-13 MED ORDER — METHOCARBAMOL 500 MG PO TABS
500.0000 mg | ORAL_TABLET | Freq: Every day | ORAL | 2 refills | Status: AC
Start: 2021-01-13 — End: ?

## 2021-01-13 NOTE — Patient Instructions (Signed)
Get Voltaren gel over the counter - apply small amount as needed to finger joint

## 2021-01-13 NOTE — Progress Notes (Signed)
Date:  01/13/2021   Name:  Teresa Rivas   DOB:  10-03-1966   MRN:  [id number]   Chief Complaint: Diabetes (POCT A1C, Eye Exam- none this past year. Patient said she will schedule eye exam.) and Hand Pain (Middle Left Finger pain. Hurts more in the last 2 months. Hurts more to move it. Has to open boxes at work and its very painful to do so.)  Diabetes She presents for her follow-up diabetic visit. She has type 2 diabetes mellitus. Her disease course has been stable. Pertinent negatives for hypoglycemia include no headaches or tremors. Pertinent negatives for diabetes include no chest pain, no fatigue, no polydipsia and no polyuria. Current diabetic treatment includes oral agent (dual therapy) (metformin and rybelsus). An ACE inhibitor/angiotensin II receptor blocker is being taken. Eye exam is not current.  Hand Pain  There was no injury mechanism. Pain location: left middle finger distal joint. Pertinent negatives include no chest pain or numbness. She has tried nothing for the symptoms.     Lab Results  Component Value Date   CREATININE 0.68 09/06/2020   BUN 18 09/06/2020   NA 140 09/06/2020   K 4.2 09/06/2020   CL 104 09/06/2020   CO2 30 09/06/2020   Lab Results  Component Value Date   CHOL 148 09/06/2020   HDL 64 09/06/2020   LDLCALC 79 09/06/2020   TRIG 24 09/06/2020   CHOLHDL 2.3 09/06/2020   Lab Results  Component Value Date   TSH 1.692 09/06/2020   Lab Results  Component Value Date   HGBA1C 6.1 (H) 09/06/2020   Lab Results  Component Value Date   WBC 5.0 09/06/2020   HGB 11.7 (L) 09/06/2020   HCT 38.1 09/06/2020   MCV 84.5 09/06/2020   PLT 232 09/06/2020   Lab Results  Component Value Date   ALT 14 09/06/2020   AST 19 09/06/2020   ALKPHOS 61 09/06/2020   BILITOT 0.5 09/06/2020     Review of Systems  Constitutional: Negative for appetite change, fatigue, fever and unexpected weight change.  HENT: Negative for tinnitus and trouble swallowing.    Eyes: Negative for visual disturbance.  Respiratory: Negative for cough, chest tightness and shortness of breath.   Cardiovascular: Negative for chest pain, palpitations and leg swelling.  Gastrointestinal: Negative for abdominal pain.  Endocrine: Negative for polydipsia and polyuria.  Genitourinary: Negative for dysuria and hematuria.  Musculoskeletal: Negative for arthralgias.  Neurological: Negative for tremors, numbness and headaches.  Psychiatric/Behavioral: Negative for dysphoric mood.    Patient Active Problem List   Diagnosis Date Noted  . S/P total hysterectomy and bilateral salpingo-oophorectomy 09/05/2020  . 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 Type II diabetes mellitus with complication (HCC) 05/22/2017  . Mixed hyperlipidemia 05/22/2017  . Essential hypertension 05/22/2017  . Renal stones 05/22/2017    Allergies  Allergen Reactions  . Codeine     Past Surgical History:  Procedure Laterality Date  . ABDOMINAL HYSTERECTOMY  07/28/2016   total due to fibroids  . TONSILLECTOMY      Social History   Tobacco Use  . Smoking status: Never Smoker  . Smokeless tobacco: Never Used  Vaping Use  . Vaping Use: Never used  Substance Use Topics  . Alcohol use: No  . Drug use: No     Medication list has been reviewed and updated.  Current Meds  Medication Sig  . glucose blood test strip 1 each by Other route as needed for other. Use as  instructed  . ibuprofen (ADVIL,MOTRIN) 800 MG tablet Take 1 tablet (800 mg total) by mouth every 8 (eight) hours as needed.  Marland Kitchen lisinopril (ZESTRIL) 5 MG tablet Take 1 tablet (5 mg total) by mouth daily.  . metFORMIN (GLUCOPHAGE) 1000 MG tablet TAKE 1 TABLET BY MOUTH TWICE DAILY WITH A MEAL  . methocarbamol (ROBAXIN) 500 MG tablet Take 1 tablet (500 mg total) by mouth at bedtime.  Marland Kitchen omeprazole (PRILOSEC) 40 MG capsule Take 40 mg by mouth daily.  . rosuvastatin (CRESTOR) 10 MG tablet Take 1 tablet (10 mg total) by mouth daily.   . RYBELSUS 3 MG TABS Take 1 tablet by mouth once daily    PHQ 2/9 Scores 01/13/2021 09/06/2020 05/07/2020 01/08/2020  PHQ - 2 Score 0 0 0 0  PHQ- 9 Score 0 0 0 -    GAD 7 : Generalized Anxiety Score 01/13/2021 05/07/2020  Nervous, Anxious, on Edge 0 0  Control/stop worrying 0 0  Worry too much - different things 0 0  Trouble relaxing 0 0  Restless 0 0  Easily annoyed or irritable 0 0  Afraid - awful might happen 0 0  Total GAD 7 Score 0 0  Anxiety Difficulty Not difficult at all Not difficult at all    BP Readings from Last 3 Encounters:  01/13/21 132/82  09/06/20 130/80  05/07/20 126/78    Physical Exam Vitals and nursing note reviewed.  Constitutional:      General: She is not in acute distress.    Appearance: She is well-developed.  HENT:     Head: Normocephalic and atraumatic.  Cardiovascular:     Rate and Rhythm: Normal rate and regular rhythm.     Pulses: Normal pulses.     Heart sounds: Normal heart sounds. No murmur heard.   Pulmonary:     Effort: Pulmonary effort is normal. No respiratory distress.     Breath sounds: Normal breath sounds. No wheezing or rhonchi.  Musculoskeletal:     Left hand: Deformity (mild deviation 3rd DIP) present. No swelling, tenderness or bony tenderness. Normal strength. Normal sensation.     Right lower leg: No edema.     Left lower leg: No edema.  Skin:    General: Skin is warm and dry.     Capillary Refill: Capillary refill takes less than 2 seconds.     Findings: No rash.  Neurological:     General: No focal deficit present.     Mental Status: She is alert and oriented to person, place, and time.  Psychiatric:        Mood and Affect: Mood normal.        Behavior: Behavior normal.     Wt Readings from Last 3 Encounters:  01/13/21 140 lb (63.5 kg)  09/06/20 134 lb 9.6 oz (61.1 kg)  05/07/20 137 lb (62.1 kg)    BP 132/82   Pulse 85   Ht 5\' 1"  (1.549 m)   Wt 140 lb (63.5 kg)   LMP  (LMP Unknown)   SpO2 99%   BMI  26.45 kg/m   Assessment and Plan: 1. Type II diabetes mellitus with complication (HCC) Clinically stable by exam and report without s/s of hypoglycemia. DM complicated by lipids. Tolerating medications well without side effects or other concerns but having trouble getting Rybelsus for 90 days. - POCT glycosylated hemoglobin (Hb A1C)= 6.9 - Semaglutide (RYBELSUS) 3 MG TABS; Take 1 tablet by mouth daily.  Dispense: 30 tablet; Refill: 5  2.  Finger pain, left Mild tendinopathy or OA Recommend Voltaren get  3. Essential hypertension Clinically stable exam with well controlled BP on lisinopril. Tolerating medications without side effects at this time. Pt to continue current regimen and low sodium diet; benefits of regular exercise as able discussed. - lisinopril (ZESTRIL) 5 MG tablet; Take 1 tablet (5 mg total) by mouth daily.  Dispense: 90 tablet; Refill: 3  4. Mixed hyperlipidemia - rosuvastatin (CRESTOR) 10 MG tablet; Take 1 tablet (10 mg total) by mouth daily.  Dispense: 90 tablet; Refill: 3   5. Muscle spasm of back - methocarbamol (ROBAXIN) 500 MG tablet; Take 1 tablet (500 mg total) by mouth at bedtime.  Dispense: 30 tablet; Refill: 2   Partially dictated using Animal nutritionist. Any errors are unintentional.  Bari Edward, MD [facility]  01/13/2021

## 2021-01-19 ENCOUNTER — Other Ambulatory Visit
Admission: RE | Admit: 2021-01-19 | Discharge: 2021-01-19 | Disposition: A | Discharge status: Home / Self Care | Payer: 59 | Source: Ambulatory Visit | Attending: Gastroenterology | Admitting: Gastroenterology

## 2021-01-19 ENCOUNTER — Other Ambulatory Visit: Payer: Self-pay

## 2021-01-19 DIAGNOSIS — Z01812 Encounter for preprocedural laboratory examination: Secondary | ICD-10-CM | POA: Insufficient documentation

## 2021-01-19 DIAGNOSIS — Z20822 Contact with and (suspected) exposure to covid-19: Secondary | ICD-10-CM | POA: Diagnosis not present

## 2021-01-19 LAB — SARS CORONAVIRUS 2 (TAT 6-24 HRS): SARS Coronavirus 2: NEGATIVE

## 2021-01-21 ENCOUNTER — Other Ambulatory Visit: Payer: Self-pay

## 2021-01-21 ENCOUNTER — Encounter
Admission: RE | Disposition: A | Discharge status: Home / Self Care | Payer: Self-pay | Source: Ambulatory Visit | Attending: Gastroenterology

## 2021-01-21 ENCOUNTER — Ambulatory Visit
Admission: RE | Admit: 2021-01-21 | Discharge: 2021-01-21 | Disposition: A | Discharge status: Home / Self Care | Payer: 59 | Source: Ambulatory Visit | Attending: Gastroenterology | Admitting: Gastroenterology

## 2021-01-21 ENCOUNTER — Ambulatory Visit: Payer: 59 | Admitting: Certified Registered Nurse Anesthetist

## 2021-01-21 DIAGNOSIS — Z885 Allergy status to narcotic agent status: Secondary | ICD-10-CM | POA: Diagnosis not present

## 2021-01-21 DIAGNOSIS — K6389 Other specified diseases of intestine: Secondary | ICD-10-CM | POA: Insufficient documentation

## 2021-01-21 DIAGNOSIS — K640 First degree hemorrhoids: Secondary | ICD-10-CM | POA: Insufficient documentation

## 2021-01-21 DIAGNOSIS — Z800 Family history of malignant neoplasm of digestive organs: Secondary | ICD-10-CM | POA: Insufficient documentation

## 2021-01-21 DIAGNOSIS — Z1211 Encounter for screening for malignant neoplasm of colon: Secondary | ICD-10-CM | POA: Insufficient documentation

## 2021-01-21 DIAGNOSIS — Z79899 Other long term (current) drug therapy: Secondary | ICD-10-CM | POA: Diagnosis not present

## 2021-01-21 DIAGNOSIS — Z7984 Long term (current) use of oral hypoglycemic drugs: Secondary | ICD-10-CM | POA: Insufficient documentation

## 2021-01-21 HISTORY — DX: Personal history of urinary calculi: Z87.442

## 2021-01-21 HISTORY — PX: COLONOSCOPY WITH PROPOFOL: SHX5780

## 2021-01-21 LAB — GLUCOSE, CAPILLARY: Glucose-Capillary: 71 mg/dL (ref 70–99)

## 2021-01-21 SURGERY — COLONOSCOPY WITH PROPOFOL
Anesthesia: General

## 2021-01-21 MED ORDER — PROPOFOL 500 MG/50ML IV EMUL
INTRAVENOUS | Status: DC | PRN
  Administered 2021-01-21: 160 ug/kg/min via INTRAVENOUS

## 2021-01-21 MED ORDER — PROPOFOL 10 MG/ML IV BOLUS
INTRAVENOUS | Status: DC | PRN
  Administered 2021-01-21: 60 mg via INTRAVENOUS
  Administered 2021-01-21 (×2): 20 mg via INTRAVENOUS

## 2021-01-21 MED ORDER — SODIUM CHLORIDE 0.9 % IV SOLN: INTRAVENOUS | Status: DC

## 2021-01-21 NOTE — H&P (Signed)
Outpatient short stay form Pre-procedure 01/21/2021 8:58 AM Merlyn Lot MD, MPH  Primary Physician: Dr. Judithann Graves  Reason for visit:  Screening  History of present illness:   55 y/o lady with family history of colon cancer in her Mother who was older than 76 when diagnosed with colon cancer. This is her index colonoscopy. No blood thinners. History of hysterectomy. No new GI symptoms.    Current Facility-Administered Medications:  .  0.9 %  sodium chloride infusion, , Intravenous, Continuous, Locklear, Sheria Lang T, MD .  0.9 %  sodium chloride infusion, , Intravenous, Continuous, Locklear, Rossie Muskrat, MD, Last Rate: 20 mL/hr at 01/21/21 9528, Continued from Pre-op at 01/21/21 0835  Medications Prior to Admission  Medication Sig Dispense Refill Last Dose  . ibuprofen (ADVIL,MOTRIN) 800 MG tablet Take 1 tablet (800 mg total) by mouth every 8 (eight) hours as needed. 90 tablet 0 Past Month at Unknown time  . lisinopril (ZESTRIL) 5 MG tablet Take 1 tablet (5 mg total) by mouth daily. 90 tablet 3 01/21/2021 at Unknown time  . metFORMIN (GLUCOPHAGE) 1000 MG tablet TAKE 1 TABLET BY MOUTH TWICE DAILY WITH A MEAL 180 tablet 0 01/20/2021 at Unknown time  . rosuvastatin (CRESTOR) 10 MG tablet Take 1 tablet (10 mg total) by mouth daily. 90 tablet 3 01/21/2021 at 0600  . Semaglutide (RYBELSUS) 3 MG TABS Take 1 tablet by mouth daily. 30 tablet 5 Past Week at Unknown time  . glucose blood test strip 1 each by Other route as needed for other. Use as instructed     . methocarbamol (ROBAXIN) 500 MG tablet Take 1 tablet (500 mg total) by mouth at bedtime. 30 tablet 2 01/19/2021  . omeprazole (PRILOSEC) 40 MG capsule Take 40 mg by mouth daily. (Patient not taking: Reported on 01/21/2021)   Not Taking at Unknown time  . simvastatin (ZOCOR) 10 MG tablet Take 10 mg by mouth daily. (Patient not taking: Reported on 01/21/2021)   Not Taking at Unknown time     Allergies  Allergen Reactions  . Codeine      Past  Medical History:  Diagnosis Date  . Diabetes mellitus without complication (HCC)   . History of kidney stones   . Hyperlipidemia   . Hypertension   . Kidney stones 04/27/2017    Review of systems:  Otherwise negative.    Physical Exam  Gen: Alert, oriented. Appears stated age.  HEENT: PERRLA. Lungs: No respiratory distress CV: RRR Abd: soft, benign, no masses Ext: No edema    Planned procedures: Proceed with colonoscopy. The patient understands the nature of the planned procedure, indications, risks, alternatives and potential complications including but not limited to bleeding, infection, perforation, damage to internal organs and possible oversedation/side effects from anesthesia. The patient agrees and gives consent to proceed.  Please refer to procedure notes for findings, recommendations and patient disposition/instructions.     Merlyn Lot MD, MPH Gastroenterology 01/21/2021  8:58 AM

## 2021-01-21 NOTE — Anesthesia Preprocedure Evaluation (Signed)
Anesthesia Evaluation  Patient identified by MRN, date of birth, ID band Patient awake    Reviewed: Allergy & Precautions, NPO status , Patient's Chart, lab work & pertinent test results  Airway Mallampati: II  TM Distance: >3 FB     Dental   Pulmonary neg pulmonary ROS,    Pulmonary exam normal        Cardiovascular hypertension, Normal cardiovascular exam     Neuro/Psych negative neurological ROS  negative psych ROS   GI/Hepatic negative GI ROS, Neg liver ROS,   Endo/Other  diabetes  Renal/GU stones  negative genitourinary   Musculoskeletal  (+) Arthritis , Osteoarthritis,    Abdominal Normal abdominal exam  (+)   Peds negative pediatric ROS (+)  Hematology negative hematology ROS (+)   Anesthesia Other Findings Past Medical History: No date: Diabetes mellitus without complication (HCC) No date: History of kidney stones No date: Hyperlipidemia No date: Hypertension 04/27/2017: Kidney stones  Reproductive/Obstetrics                             Anesthesia Physical Anesthesia Plan  ASA: II  Anesthesia Plan: General   Post-op Pain Management:    Induction: Intravenous  PONV Risk Score and Plan:   Airway Management Planned: Nasal Cannula  Additional Equipment:   Intra-op Plan:   Post-operative Plan:   Informed Consent: I have reviewed the patients History and Physical, chart, labs and discussed the procedure including the risks, benefits and alternatives for the proposed anesthesia with the patient or authorized representative who has indicated his/her understanding and acceptance.     Dental advisory given  Plan Discussed with: CRNA and Surgeon  Anesthesia Plan Comments:         Anesthesia Quick Evaluation

## 2021-01-21 NOTE — Interval H&P Note (Signed)
History and Physical Interval Note:  01/21/2021 9:01 AM  Teresa Rivas  has presented today for surgery, with the diagnosis of family history colon cancer.  The various methods of treatment have been discussed with the patient and family. After consideration of risks, benefits and other options for treatment, the patient has consented to  Procedure(s): COLONOSCOPY WITH PROPOFOL (N/A) as a surgical intervention.  The patient's history has been reviewed, patient examined, no change in status, stable for surgery.  I have reviewed the patient's chart and labs.  Questions were answered to the patient's satisfaction.     Regis Bill  Ok to proceed with colonoscopy

## 2021-01-21 NOTE — Transfer of Care (Signed)
Immediate Anesthesia Transfer of Care Note  Patient: Teresa Rivas  Procedure(s) Performed: COLONOSCOPY WITH PROPOFOL (N/A )  Patient Location: PACU  Anesthesia Type:General  Level of Consciousness: awake and alert   Airway & Oxygen Therapy: Patient Spontanous Breathing and Patient connected to nasal cannula oxygen  Post-op Assessment: Report given to RN and Post -op Vital signs reviewed and stable  Post vital signs: Reviewed and stable  Last Vitals:  Vitals Value Taken Time  BP 127/80 01/21/21 0927  Temp 36.2 C 01/21/21 0927  Pulse 93 01/21/21 0928  Resp 16 01/21/21 0928  SpO2 100 % 01/21/21 0928  Vitals shown include unvalidated device data.  Last Pain:  Vitals:   01/21/21 0927  TempSrc: Temporal  PainSc:          Complications: No complications documented.

## 2021-01-21 NOTE — Op Note (Signed)
[facility] Patient Name: Teresa Rivas Procedure Date: 01/21/2021 8:54 AM MRN: [id number] Account #: [id number] Date of Birth: July 02, 1966 Admit Type: Outpatient Age: 55 Room: [facility] ENDO ROOM 3 Gender: Female Note Status: Finalized Procedure:             Colonoscopy Indications:           Screening in patient at increased risk: Colorectal                         cancer in mother 66 or older Providers:             Andrey Farmer MD, MD Medicines:             Monitored Anesthesia Care Complications:         No immediate complications. Estimated blood loss:                         Minimal. Procedure:             Pre-Anesthesia Assessment:                        - Prior to the procedure, a History and Physical was                         performed, and patient medications and allergies were                         reviewed. The patient is competent. The risks and                         benefits of the procedure and the sedation options and                         risks were discussed with the patient. All questions                         were answered and informed consent was obtained.                         Patient identification and proposed procedure were                         verified by the physician, the nurse, the anesthetist                         and the technician in the endoscopy suite. Mental                         Status Examination: alert and oriented. Airway                         Examination: normal oropharyngeal airway and neck                         mobility. Respiratory Examination: clear to                         auscultation. CV Examination: normal. Prophylactic  Antibiotics: The patient does not require prophylactic                         antibiotics. Prior Anticoagulants: The patient has                         taken no previous anticoagulant or antiplatelet                         agents.  ASA Grade Assessment: II - A patient with mild                         systemic disease. After reviewing the risks and                         benefits, the patient was deemed in satisfactory                         condition to undergo the procedure. The anesthesia                         plan was to use monitored anesthesia care (MAC).                         Immediately prior to administration of medications,                         the patient was re-assessed for adequacy to receive                         sedatives. The heart rate, respiratory rate, oxygen                         saturations, blood pressure, adequacy of pulmonary                         ventilation, and response to care were monitored                         throughout the procedure. The physical status of the                         patient was re-assessed after the procedure.                        After obtaining informed consent, the colonoscope was                         passed under direct vision. Throughout the procedure,                         the patient's blood pressure, pulse, and oxygen                         saturations were monitored continuously. The                         Colonoscope was introduced through the anus and  advanced to the the cecum, identified by appendiceal                         orifice and ileocecal valve. The colonoscopy was                         performed without difficulty. The patient tolerated                         the procedure well. The quality of the bowel                         preparation was excellent. Findings:      The perianal and digital rectal examinations were normal.      A localized area of granular mucosa was found at the appendiceal       orifice. Biopsies were taken with a cold forceps for histology.       Estimated blood loss was minimal.      Non-bleeding internal hemorrhoids were found during retroflexion. The       hemorrhoids  were Grade I (internal hemorrhoids that do not prolapse).      The exam was otherwise without abnormality on direct and retroflexion       views. Impression:            - Granularity at the appendiceal orifice. Biopsied.                        - Non-bleeding internal hemorrhoids.                        - The examination was otherwise normal on direct and                         retroflexion views. Recommendation:        - Discharge patient to home.                        - Resume previous diet.                        - Continue present medications.                        - Await pathology results.                        - Repeat colonoscopy date to be determined after                         pending pathology results are reviewed for screening                         purposes.                        - Return to referring physician as previously                         scheduled. Procedure Code(s):     --- Professional ---  45380, Colonoscopy, flexible; with biopsy, single or                         multiple Diagnosis Code(s):     --- Professional ---                        Z80.0, Family history of malignant neoplasm of                         digestive organs                        K63.89, Other specified diseases of intestine                        K64.0, First degree hemorrhoids CPT copyright 2019 American Medical Association. All rights reserved. The codes documented in this report are preliminary and upon coder review may  be revised to meet current compliance requirements. Andrey Farmer MD, MD 01/21/2021 9:27:13 AM Number of Addenda: 0 Note Initiated On: 01/21/2021 8:54 AM Scope Withdrawal Time: 0 hours 8 minutes 20 seconds  Total Procedure Duration: 0 hours 14 minutes 52 seconds  Estimated Blood Loss:  Estimated blood loss was minimal.      [facility]

## 2021-01-24 ENCOUNTER — Encounter: Payer: Self-pay | Admitting: Gastroenterology

## 2021-01-25 LAB — SURGICAL PATHOLOGY

## 2021-01-25 NOTE — Anesthesia Postprocedure Evaluation (Signed)
Anesthesia Post Note  Patient: Teresa Rivas  Procedure(s) Performed: COLONOSCOPY WITH PROPOFOL (N/A )  Patient location during evaluation: Endoscopy Anesthesia Type: General Level of consciousness: awake and alert and oriented Pain management: pain level controlled Vital Signs Assessment: post-procedure vital signs reviewed and stable Respiratory status: spontaneous breathing Cardiovascular status: blood pressure returned to baseline Anesthetic complications: no   No complications documented.   Last Vitals:  Vitals:   01/21/21 0816 01/21/21 0927  BP: (!) 127/93 127/80  Pulse: 82   Resp: 20   Temp: (!) 36.3 C (!) 36.2 C  SpO2: 100%     Last Pain:  Vitals:   01/21/21 0957  TempSrc:   PainSc: 0-No pain                 Meekah Math

## 2021-02-15 LAB — HM DIABETES EYE EXAM

## 2021-03-15 ENCOUNTER — Telehealth: Payer: Self-pay

## 2021-03-15 NOTE — Telephone Encounter (Signed)
-----   Message from Reubin Milan, MD sent at 03/12/2021  5:05 PM EDT ----- Call patient and remind her to schedule her mammogram that was ordered in October.  ----- Message ----- From: SYSTEM Sent: 03/12/2021  12:13 AM EDT To: Reubin Milan, MD

## 2021-03-15 NOTE — Telephone Encounter (Signed)
Called patient and left a VM letting her know that she is needing to schedule her mammogram.   Gave her a number to call and schedule with [facility]. [phone]

## 2021-03-17 ENCOUNTER — Other Ambulatory Visit: Payer: Self-pay | Admitting: Internal Medicine

## 2021-03-17 DIAGNOSIS — Z1231 Encounter for screening mammogram for malignant neoplasm of breast: Secondary | ICD-10-CM

## 2021-03-26 ENCOUNTER — Other Ambulatory Visit: Payer: Self-pay | Admitting: Internal Medicine

## 2021-03-26 DIAGNOSIS — E119 Type 2 diabetes mellitus without complications: Secondary | ICD-10-CM

## 2021-03-26 NOTE — Telephone Encounter (Signed)
Requested Prescriptions  Pending Prescriptions Disp Refills  . metFORMIN (GLUCOPHAGE) 1000 MG tablet [Pharmacy Med Name: metFORMIN HCl 1000 MG Oral Tablet] 180 tablet 0    Sig: TAKE 1 TABLET BY MOUTH TWICE DAILY WITH A MEAL     Endocrinology:  Diabetes - Biguanides Passed - 03/26/2021 12:37 PM      Passed - Cr in normal range and within 360 days    Creatinine, Ser  Date Value Ref Range Status  09/06/2020 0.68 0.44 - 1.00 mg/dL Final         Passed - HBA1C is between 0 and 7.9 and within 180 days    Hemoglobin A1C  Date Value Ref Range Status  01/13/2021 6.6 (A) 4.0 - 5.6 % Final   Hgb A1c MFr Bld  Date Value Ref Range Status  09/06/2020 6.1 (H) 4.8 - 5.6 % Final    Comment:    (NOTE) Pre diabetes:          5.7%-6.4%  Diabetes:              >6.4%  Glycemic control for   <7.0% adults with diabetes          Passed - AA eGFR in normal range and within 360 days    GFR calc Af Amer  Date Value Ref Range Status  05/07/2020 108 >59 mL/min/1.73 Final    Comment:    **[facility] currently reports eGFR in compliance with the current**   recommendations of the [employer]. [facility] will   update reporting as new guidelines are published from the NKF-ASN   Task force.    GFR, Estimated  Date Value Ref Range Status  09/06/2020 >60 >60 mL/min Final         Passed - Valid encounter within last 6 months    Recent Outpatient Visits          2 months ago Type II diabetes mellitus with complication [facility])   [facility] Glean Hess, MD   6 months ago Annual physical exam   [facility] Glean Hess, MD   10 months ago Type II diabetes mellitus with complication [facility])   [facility] Glean Hess, MD   1 year ago Type II diabetes mellitus with complication [facility])   [facility] Glean Hess, MD   1 year ago Dental infection   [facility] Glean Hess, MD      Future Appointments            In 1  month Army Melia Jesse Sans, MD [facility] [place]   In 5 months Army Melia Jesse Sans, MD [facility], [place] [facility]

## 2021-04-13 ENCOUNTER — Ambulatory Visit: Payer: 59

## 2021-04-20 ENCOUNTER — Ambulatory Visit
Admission: RE | Admit: 2021-04-20 | Discharge: 2021-04-20 | Disposition: A | Discharge status: Home / Self Care | Payer: 59 | Source: Ambulatory Visit | Attending: Internal Medicine | Admitting: Internal Medicine

## 2021-04-20 ENCOUNTER — Other Ambulatory Visit: Payer: Self-pay

## 2021-04-20 DIAGNOSIS — Z1231 Encounter for screening mammogram for malignant neoplasm of breast: Secondary | ICD-10-CM | POA: Diagnosis not present

## 2021-04-27 ENCOUNTER — Ambulatory Visit: Payer: 59 | Admitting: Internal Medicine

## 2021-04-28 ENCOUNTER — Encounter: Payer: Self-pay | Admitting: Internal Medicine

## 2021-06-24 ENCOUNTER — Telehealth: Payer: Self-pay

## 2021-06-24 NOTE — Telephone Encounter (Signed)
Filled out out form and faxed paper.  KP

## 2021-06-24 NOTE — Telephone Encounter (Signed)
Called pt left VM to call back. Pt dropped off form for [place] to fill out. Pts most recent labs was last year 08/2020. Pt needs recent labs pt has a CPE in October.  PEC nurse may give results to patient if they return call to clinic, a CRM has been created.   KP

## 2021-06-24 NOTE — Telephone Encounter (Signed)
Pt returned call advised of paperwork filled out and faxed

## 2021-06-29 ENCOUNTER — Telehealth: Payer: Self-pay

## 2021-06-29 NOTE — Telephone Encounter (Signed)
Pt called and stated she received the fax to her phone but she wants to makes sure that it was faxed to the corporate number on the bottom of the paperwork/ please advise asap/ since it was due 8.1.22/ fax# [phone] [facility] [employer]

## 2021-06-29 NOTE — Telephone Encounter (Signed)
Paper was faxed 06/27/2021.   KP

## 2021-06-29 NOTE — Telephone Encounter (Signed)
Copied from CRM [id number]. Topic: General - Other >> Jun 29, 2021  9:29 AM Gaetana Michaelis A wrote: Reason for CRM: Patient would like to be contacted by a member of administrative staff when possible  Patient has questions and concerns about a fax that was submitted on their behalf on Friday  The patient would also like additional verification that the paperwork was completed and submitted on 06/24/21  Please contact further when possible

## 2021-06-29 NOTE — Telephone Encounter (Signed)
Pt calling to inquire if the paper has been faxed to her HR person. Pt would like that done today and a call when this is done.  She says it was faxed to the wrong number on 8/1.  thanks

## 2021-06-29 NOTE — Telephone Encounter (Signed)
Form was faxed again.  KP

## 2021-06-29 NOTE — Telephone Encounter (Signed)
Copied from CRM [id number]. Topic: General - Other >> Jun 29, 2021 12:35 PM Gaetana Michaelis A wrote: Reason for CRM: Patient has called to provide an additional fax number for their job's HR dept  Documentation can be faxed to [phone] to the attention of Sheryl   The patient would like to know when the relevant information is submitted via fax

## 2021-06-29 NOTE — Telephone Encounter (Signed)
Called pt left VM that her paper work was sent to the number that was listed on the paper as well as the fax number to [facility].  KP

## 2021-08-03 ENCOUNTER — Encounter: Payer: Self-pay | Admitting: Internal Medicine

## 2021-08-03 ENCOUNTER — Ambulatory Visit (INDEPENDENT_AMBULATORY_CARE_PROVIDER_SITE_OTHER): Payer: BC Managed Care – PPO | Admitting: Internal Medicine

## 2021-08-03 ENCOUNTER — Other Ambulatory Visit: Payer: Self-pay

## 2021-08-03 VITALS — BP 124/84 | HR 88 | Temp 98.4°F | Ht 61.0 in | Wt 133.0 lb

## 2021-08-03 DIAGNOSIS — I10 Essential (primary) hypertension: Secondary | ICD-10-CM

## 2021-08-03 DIAGNOSIS — E118 Type 2 diabetes mellitus with unspecified complications: Secondary | ICD-10-CM

## 2021-08-03 LAB — POCT GLYCOSYLATED HEMOGLOBIN (HGB A1C): Hemoglobin A1C: 5.9 % — AB (ref 4.0–5.6)

## 2021-08-03 MED ORDER — RYBELSUS 3 MG PO TABS: 1.0000 | ORAL_TABLET | Freq: Every day | ORAL | 5 refills | Status: DC

## 2021-08-03 MED ORDER — METFORMIN HCL 1000 MG PO TABS: 1000.0000 mg | ORAL_TABLET | Freq: Two times a day (BID) | ORAL | 1 refills | Status: DC

## 2021-08-03 NOTE — Progress Notes (Signed)
Date:  08/03/2021   Name:  Teresa Rivas   DOB:  12-Mar-1966   MRN:  [id number]   Chief Complaint: Diabetes (Last BS 101 this morning )  Diabetes She presents for her follow-up diabetic visit. She has type 2 diabetes mellitus. Her disease course has been stable. Pertinent negatives for hypoglycemia include no headaches or tremors. Pertinent negatives for diabetes include no chest pain, no fatigue, no polydipsia and no polyuria. Current diabetic treatment includes oral agent (dual therapy) (rybelsus and metformin). Her weight is stable. An ACE inhibitor/angiotensin II receptor blocker is being taken. Eye exam is not current.  Hypertension This is a chronic problem. The problem is controlled. Pertinent negatives include no chest pain, headaches, palpitations or shortness of breath. Past treatments include ACE inhibitors.   Lab Results  Component Value Date   CREATININE 0.68 09/06/2020   BUN 18 09/06/2020   NA 140 09/06/2020   K 4.2 09/06/2020   CL 104 09/06/2020   CO2 30 09/06/2020   Lab Results  Component Value Date   CHOL 148 09/06/2020   HDL 64 09/06/2020   LDLCALC 79 09/06/2020   TRIG 24 09/06/2020   CHOLHDL 2.3 09/06/2020   Lab Results  Component Value Date   TSH 1.692 09/06/2020   Lab Results  Component Value Date   HGBA1C 5.9 (A) 08/03/2021   Lab Results  Component Value Date   WBC 5.0 09/06/2020   HGB 11.7 (L) 09/06/2020   HCT 38.1 09/06/2020   MCV 84.5 09/06/2020   PLT 232 09/06/2020   Lab Results  Component Value Date   ALT 14 09/06/2020   AST 19 09/06/2020   ALKPHOS 61 09/06/2020   BILITOT 0.5 09/06/2020     Review of Systems  Constitutional:  Negative for appetite change, fatigue, fever and unexpected weight change.  HENT:  Negative for tinnitus and trouble swallowing.   Eyes:  Negative for visual disturbance.  Respiratory:  Negative for cough, chest tightness and shortness of breath.   Cardiovascular:  Negative for chest pain, palpitations and  leg swelling.  Gastrointestinal:  Negative for abdominal pain.  Endocrine: Negative for polydipsia and polyuria.  Genitourinary:  Negative for dysuria and hematuria.  Musculoskeletal:  Negative for arthralgias.  Neurological:  Negative for tremors, numbness and headaches.  Psychiatric/Behavioral:  Negative for dysphoric mood.    Patient Active Problem List   Diagnosis Date Noted   S/P total hysterectomy and bilateral salpingo-oophorectomy 09/05/2020   Epicondylitis, lateral, right 09/26/2017   Type II diabetes mellitus with complication (HCC) 05/22/2017   Mixed hyperlipidemia 05/22/2017   Essential hypertension 05/22/2017   Renal stones 05/22/2017    Allergies  Allergen Reactions   Codeine     Past Surgical History:  Procedure Laterality Date   ABDOMINAL HYSTERECTOMY  07/28/2016   total due to fibroids   COLONOSCOPY WITH PROPOFOL N/A 01/21/2021   Procedure: COLONOSCOPY WITH PROPOFOL;  Surgeon: Regis Bill, MD;  Location: [facility] ENDOSCOPY;  Service: Endoscopy;  Laterality: N/A;   TONSILLECTOMY      Social History   Tobacco Use   Smoking status: Never   Smokeless tobacco: Never  Vaping Use   Vaping Use: Never used  Substance Use Topics   Alcohol use: No   Drug use: No     Medication list has been reviewed and updated.  Current Meds  Medication Sig   glucose blood test strip 1 each by Other route as needed for other. Use as instructed   ibuprofen (ADVIL,MOTRIN)  800 MG tablet Take 1 tablet (800 mg total) by mouth every 8 (eight) hours as needed.   lisinopril (ZESTRIL) 5 MG tablet Take 1 tablet (5 mg total) by mouth daily.   methocarbamol (ROBAXIN) 500 MG tablet Take 1 tablet (500 mg total) by mouth at bedtime. (Patient taking differently: Take 500 mg by mouth as needed.)   rosuvastatin (CRESTOR) 10 MG tablet Take 1 tablet (10 mg total) by mouth daily.   simvastatin (ZOCOR) 10 MG tablet Take 10 mg by mouth daily.   VITAMIN D PO Take by mouth daily. Gummies    [DISCONTINUED] metFORMIN (GLUCOPHAGE) 1000 MG tablet TAKE 1 TABLET BY MOUTH TWICE DAILY WITH A MEAL   [DISCONTINUED] Semaglutide (RYBELSUS) 3 MG TABS Take 1 tablet by mouth daily.    PHQ 2/9 Scores 08/03/2021 01/13/2021 09/06/2020 05/07/2020  PHQ - 2 Score 0 0 0 0  PHQ- 9 Score 0 0 0 0    GAD 7 : Generalized Anxiety Score 08/03/2021 01/13/2021 05/07/2020  Nervous, Anxious, on Edge 0 0 0  Control/stop worrying 0 0 0  Worry too much - different things 0 0 0  Trouble relaxing 0 0 0  Restless 0 0 0  Easily annoyed or irritable 0 0 0  Afraid - awful might happen 0 0 0  Total GAD 7 Score 0 0 0  Anxiety Difficulty - Not difficult at all Not difficult at all    BP Readings from Last 3 Encounters:  08/03/21 124/84  01/21/21 127/80  01/13/21 132/82    Physical Exam Vitals and nursing note reviewed.  Constitutional:      General: She is not in acute distress.    Appearance: She is well-developed.  HENT:     Head: Normocephalic and atraumatic.  Neck:     Vascular: No carotid bruit.  Cardiovascular:     Rate and Rhythm: Normal rate and regular rhythm.     Pulses: Normal pulses.  Pulmonary:     Effort: Pulmonary effort is normal. No respiratory distress.  Musculoskeletal:     Cervical back: Normal range of motion.     Right lower leg: No edema.     Left lower leg: No edema.  Lymphadenopathy:     Cervical: No cervical adenopathy.  Skin:    General: Skin is warm and dry.     Capillary Refill: Capillary refill takes less than 2 seconds.     Findings: No rash.  Neurological:     General: No focal deficit present.     Mental Status: She is alert and oriented to person, place, and time.  Psychiatric:        Mood and Affect: Mood normal.        Behavior: Behavior normal.    Wt Readings from Last 3 Encounters:  08/03/21 133 lb (60.3 kg)  01/21/21 138 lb (62.6 kg)  01/13/21 140 lb (63.5 kg)    BP 124/84   Pulse 88   Temp 98.4 F (36.9 C) (Oral)   Ht 5\' 1"  (1.549 m)   Wt 133 lb  (60.3 kg)   LMP  (LMP Unknown)   SpO2 99%   BMI 25.13 kg/m   Assessment and Plan: 1. Type II diabetes mellitus with complication (HCC) Clinically stable by exam and report without s/s of hypoglycemia. DM complicated by hypertension and dyslipidemia. Tolerating medications well without side effects or other concerns. - POCT glycosylated hemoglobin (Hb A1C)= 5.9 - metFORMIN (GLUCOPHAGE) 1000 MG tablet; Take 1 tablet (1,000 mg total)  by mouth 2 (two) times daily with a meal.  Dispense: 180 tablet; Refill: 1 - Semaglutide (RYBELSUS) 3 MG TABS; Take 1 tablet by mouth daily.  Dispense: 30 tablet; Refill: 5  2. Essential hypertension Clinically stable exam with well controlled BP. Tolerating medications without side effects at this time. Pt to continue current regimen and low sodium diet; benefits of regular exercise as able discussed.   Partially dictated using Animal nutritionist. Any errors are unintentional.  Bari Edward, MD [facility]  08/03/2021

## 2021-09-09 ENCOUNTER — Encounter: Payer: Self-pay | Admitting: Internal Medicine

## 2021-09-09 ENCOUNTER — Other Ambulatory Visit: Payer: Self-pay

## 2021-09-09 ENCOUNTER — Other Ambulatory Visit: Payer: Self-pay | Admitting: Internal Medicine

## 2021-09-09 ENCOUNTER — Ambulatory Visit (INDEPENDENT_AMBULATORY_CARE_PROVIDER_SITE_OTHER): Payer: BC Managed Care – PPO | Admitting: Internal Medicine

## 2021-09-09 VITALS — BP 122/70 | HR 74 | Ht 61.0 in | Wt 133.0 lb

## 2021-09-09 DIAGNOSIS — E1169 Type 2 diabetes mellitus with other specified complication: Secondary | ICD-10-CM

## 2021-09-09 DIAGNOSIS — E118 Type 2 diabetes mellitus with unspecified complications: Secondary | ICD-10-CM

## 2021-09-09 DIAGNOSIS — Z23 Encounter for immunization: Secondary | ICD-10-CM

## 2021-09-09 DIAGNOSIS — E785 Hyperlipidemia, unspecified: Secondary | ICD-10-CM | POA: Diagnosis not present

## 2021-09-09 DIAGNOSIS — I10 Essential (primary) hypertension: Secondary | ICD-10-CM | POA: Diagnosis not present

## 2021-09-09 DIAGNOSIS — R319 Hematuria, unspecified: Secondary | ICD-10-CM

## 2021-09-09 DIAGNOSIS — Z0000 Encounter for general adult medical examination without abnormal findings: Secondary | ICD-10-CM | POA: Diagnosis not present

## 2021-09-09 DIAGNOSIS — Z1231 Encounter for screening mammogram for malignant neoplasm of breast: Secondary | ICD-10-CM | POA: Diagnosis not present

## 2021-09-09 LAB — POCT URINALYSIS DIPSTICK
Bilirubin, UA: NEGATIVE
Glucose, UA: NEGATIVE
Ketones, UA: NEGATIVE
Leukocytes, UA: NEGATIVE
Nitrite, UA: NEGATIVE
Protein, UA: NEGATIVE
Spec Grav, UA: 1.015 (ref 1.010–1.025)
Urobilinogen, UA: 0.2 E.U./dL
pH, UA: 5 (ref 5.0–8.0)

## 2021-09-09 NOTE — Progress Notes (Signed)
Date:  09/09/2021   Name:  Teresa Rivas   DOB:  October 19, 1966   MRN:  [id number]   Chief Complaint: Annual Exam (Breast Exam. No pap smear. Foot exam. UA.) Teresa Rivas is a 55 y.o. female who presents today for her Complete Annual Exam. She feels well. She reports walking every day. She reports she is sleeping well. Breast complaints - none.  Mammogram: 03/2021 DEXA: none Pap smear: discontinued Colonoscopy: 12/2020  Immunization History  Administered Date(s) Administered   Influenza,inj,Quad PF,6+ Mos 09/02/2019   Influenza-Unspecified 09/02/2019   Moderna Sars-Covid-2 Vaccination 07/15/2020, 08/12/2020   Pneumococcal Conjugate-13 01/29/2014    Hypertension This is a chronic problem. The problem is controlled. Pertinent negatives include no chest pain, headaches, palpitations or shortness of breath. Past treatments include ACE inhibitors. The current treatment provides significant improvement.  Diabetes She presents for her follow-up diabetic visit. She has type 2 diabetes mellitus. Her disease course has been stable. Pertinent negatives for hypoglycemia include no dizziness, headaches, nervousness/anxiousness or tremors. Pertinent negatives for diabetes include no chest pain, no fatigue, no polydipsia and no polyuria. Current diabetic treatment includes oral agent (dual therapy) (metformin and Rybelsus). She is compliant with treatment all of the time. An ACE inhibitor/angiotensin II receptor blocker is being taken.  Hyperlipidemia This is a chronic problem. The problem is controlled. Pertinent negatives include no chest pain or shortness of breath. Current antihyperlipidemic treatment includes statins. The current treatment provides significant improvement of lipids.   Lab Results  Component Value Date   CREATININE 0.68 09/06/2020   BUN 18 09/06/2020   NA 140 09/06/2020   K 4.2 09/06/2020   CL 104 09/06/2020   CO2 30 09/06/2020   Lab Results  Component Value Date   CHOL  148 09/06/2020   HDL 64 09/06/2020   LDLCALC 79 09/06/2020   TRIG 24 09/06/2020   CHOLHDL 2.3 09/06/2020   Lab Results  Component Value Date   TSH 1.692 09/06/2020   Lab Results  Component Value Date   HGBA1C 5.9 (A) 08/03/2021   Lab Results  Component Value Date   WBC 5.0 09/06/2020   HGB 11.7 (L) 09/06/2020   HCT 38.1 09/06/2020   MCV 84.5 09/06/2020   PLT 232 09/06/2020   Lab Results  Component Value Date   ALT 14 09/06/2020   AST 19 09/06/2020   ALKPHOS 61 09/06/2020   BILITOT 0.5 09/06/2020     Review of Systems  Constitutional:  Negative for chills, fatigue and fever.  HENT:  Negative for congestion, hearing loss, tinnitus, trouble swallowing and voice change.   Eyes:  Negative for visual disturbance.  Respiratory:  Negative for cough, chest tightness, shortness of breath and wheezing.   Cardiovascular:  Negative for chest pain, palpitations and leg swelling.  Gastrointestinal:  Negative for abdominal pain, constipation, diarrhea and vomiting.  Endocrine: Negative for polydipsia and polyuria.  Genitourinary:  Negative for dysuria, frequency, genital sores, vaginal bleeding and vaginal discharge.  Musculoskeletal:  Negative for arthralgias, gait problem and joint swelling.  Skin:  Negative for color change and rash.  Neurological:  Negative for dizziness, tremors, light-headedness and headaches.  Hematological:  Negative for adenopathy. Does not bruise/bleed easily.  Psychiatric/Behavioral:  Negative for dysphoric mood and sleep disturbance. The patient is not nervous/anxious.    Patient Active Problem List   Diagnosis Date Noted   S/P total hysterectomy and bilateral salpingo-oophorectomy 09/05/2020   Epicondylitis, lateral, right 09/26/2017   Type II diabetes mellitus with complication (HCC)  05/22/2017   Hyperlipidemia associated with type 2 diabetes mellitus (HCC) 05/22/2017   Essential hypertension 05/22/2017   Renal stones 05/22/2017    Allergies   Allergen Reactions   Codeine     Past Surgical History:  Procedure Laterality Date   ABDOMINAL HYSTERECTOMY  07/28/2016   total due to fibroids   COLONOSCOPY WITH PROPOFOL N/A 01/21/2021   Procedure: COLONOSCOPY WITH PROPOFOL;  Surgeon: Regis Bill, MD;  Location: [facility] ENDOSCOPY;  Service: Endoscopy;  Laterality: N/A;   TONSILLECTOMY      Social History   Tobacco Use   Smoking status: Never   Smokeless tobacco: Never  Vaping Use   Vaping Use: Never used  Substance Use Topics   Alcohol use: No   Drug use: No     Medication list has been reviewed and updated.  Current Meds  Medication Sig   glucose blood test strip 1 each by Other route as needed for other. Use as instructed   ibuprofen (ADVIL,MOTRIN) 800 MG tablet Take 1 tablet (800 mg total) by mouth every 8 (eight) hours as needed.   lisinopril (ZESTRIL) 5 MG tablet Take 1 tablet (5 mg total) by mouth daily.   metFORMIN (GLUCOPHAGE) 1000 MG tablet Take 1 tablet (1,000 mg total) by mouth 2 (two) times daily with a meal.   methocarbamol (ROBAXIN) 500 MG tablet Take 1 tablet (500 mg total) by mouth at bedtime. (Patient taking differently: Take 500 mg by mouth as needed.)   rosuvastatin (CRESTOR) 10 MG tablet Take 1 tablet (10 mg total) by mouth daily.   Semaglutide (RYBELSUS) 3 MG TABS Take 1 tablet by mouth daily.   simvastatin (ZOCOR) 10 MG tablet Take 10 mg by mouth daily.   VITAMIN D PO Take by mouth daily. Gummies    PHQ 2/9 Scores 09/09/2021 08/03/2021 01/13/2021 09/06/2020  PHQ - 2 Score 0 0 0 0  PHQ- 9 Score 0 0 0 0    GAD 7 : Generalized Anxiety Score 09/09/2021 08/03/2021 01/13/2021 05/07/2020  Nervous, Anxious, on Edge 0 0 0 0  Control/stop worrying 0 0 0 0  Worry too much - different things 0 0 0 0  Trouble relaxing 0 0 0 0  Restless 0 0 0 0  Easily annoyed or irritable 0 0 0 0  Afraid - awful might happen 0 0 0 0  Total GAD 7 Score 0 0 0 0  Anxiety Difficulty Not difficult at all - Not difficult at  all Not difficult at all    BP Readings from Last 3 Encounters:  09/09/21 122/70  08/03/21 124/84  01/21/21 127/80    Physical Exam Vitals and nursing note reviewed.  Constitutional:      General: She is not in acute distress.    Appearance: She is well-developed.  HENT:     Head: Normocephalic and atraumatic.     Right Ear: Tympanic membrane and ear canal normal.     Left Ear: Tympanic membrane and ear canal normal.     Nose:     Right Sinus: No maxillary sinus tenderness.     Left Sinus: No maxillary sinus tenderness.  Eyes:     General: No scleral icterus.       Right eye: No discharge.        Left eye: No discharge.     Conjunctiva/sclera: Conjunctivae normal.  Neck:     Thyroid: No thyromegaly.     Vascular: No carotid bruit.  Cardiovascular:     Rate  and Rhythm: Normal rate and regular rhythm.     Pulses: Normal pulses.     Heart sounds: Normal heart sounds.  Pulmonary:     Effort: Pulmonary effort is normal. No respiratory distress.     Breath sounds: No wheezing.  Chest:  Breasts:    Right: No mass, nipple discharge, skin change or tenderness.     Left: No mass, nipple discharge, skin change or tenderness.  Abdominal:     General: Bowel sounds are normal.     Palpations: Abdomen is soft.     Tenderness: There is no abdominal tenderness.  Musculoskeletal:     Cervical back: Normal range of motion. No erythema.     Right lower leg: No edema.     Left lower leg: No edema.  Lymphadenopathy:     Cervical: No cervical adenopathy.  Skin:    General: Skin is warm and dry.     Findings: No rash.  Neurological:     Mental Status: She is alert and oriented to person, place, and time.     Cranial Nerves: No cranial nerve deficit.     Sensory: No sensory deficit.     Deep Tendon Reflexes: Reflexes are normal and symmetric.  Psychiatric:        Attention and Perception: Attention normal.        Mood and Affect: Mood normal.    Wt Readings from Last 3  Encounters:  09/09/21 133 lb (60.3 kg)  08/03/21 133 lb (60.3 kg)  01/21/21 138 lb (62.6 kg)    BP 122/70   Pulse 74   Ht 5\' 1"  (1.549 m)   Wt 133 lb (60.3 kg)   LMP  (LMP Unknown)   SpO2 99%   BMI 25.13 kg/m   Assessment and Plan: 1. Annual physical exam Normal exam - continue healthy diet/exercise Up to date on screenings and immunizations  2. Encounter for screening mammogram for breast cancer Due in March - MM 3D SCREEN BREAST BILATERAL  3. Essential hypertension Clinically stable exam with well controlled BP. Tolerating medications without side effects at this time. Pt to continue current regimen and low sodium diet; benefits of regular exercise as able discussed. - CBC with Differential/Platelet - TSH - POCT urinalysis dipstick  4. Type II diabetes mellitus with complication (HCC) Clinically stable by exam and report without s/s of hypoglycemia. DM complicated by hypertension and dyslipidemia. Tolerating medications well without side effects or other concerns. - Comprehensive metabolic panel  5. Hyperlipidemia associated with type 2 diabetes mellitus (HCC) Check labs and advise - Lipid panel  6. Need for immunization against influenza - Flu Vaccine QUAD 45mo+IM (Fluarix, Fluzone & Alfiuria Quad PF)  7. Hematuria, unspecified type Asymptomatic with hx of stones; 12 RBC/hpf on micro exam Will get culture then advise - Urine Culture   Partially dictated using Dragon software. Any errors are unintentional.  5mo, MD [facility]  09/09/2021

## 2021-09-10 LAB — COMPREHENSIVE METABOLIC PANEL
ALT: 14 IU/L (ref 0–32)
AST: 19 IU/L (ref 0–40)
Albumin/Globulin Ratio: 2.1 (ref 1.2–2.2)
Albumin: 4.7 g/dL (ref 3.8–4.9)
Alkaline Phosphatase: 69 IU/L (ref 44–121)
BUN/Creatinine Ratio: 22 (ref 9–23)
BUN: 15 mg/dL (ref 6–24)
Bilirubin Total: 0.3 mg/dL (ref 0.0–1.2)
CO2: 27 mmol/L (ref 20–29)
Calcium: 9.9 mg/dL (ref 8.7–10.2)
Chloride: 104 mmol/L (ref 96–106)
Creatinine, Ser: 0.69 mg/dL (ref 0.57–1.00)
Globulin, Total: 2.2 g/dL (ref 1.5–4.5)
Glucose: 119 mg/dL — ABNORMAL HIGH (ref 70–99)
Potassium: 4.3 mmol/L (ref 3.5–5.2)
Sodium: 144 mmol/L (ref 134–144)
Total Protein: 6.9 g/dL (ref 6.0–8.5)
eGFR: 102 mL/min/{1.73_m2} (ref >59)

## 2021-09-10 LAB — CBC WITH DIFFERENTIAL/PLATELET
Basophils Absolute: 0.1 10*3/uL (ref 0.0–0.2)
Basos: 1 %
EOS (ABSOLUTE): 0.1 10*3/uL (ref 0.0–0.4)
Eos: 2 %
Hematocrit: 37.1 % (ref 34.0–46.6)
Hemoglobin: 11.9 g/dL (ref 11.1–15.9)
Immature Grans (Abs): 0 10*3/uL (ref 0.0–0.1)
Immature Granulocytes: 0 %
Lymphocytes Absolute: 2.1 10*3/uL (ref 0.7–3.1)
Lymphs: 28 %
MCH: 26.7 pg (ref 26.6–33.0)
MCHC: 32.1 g/dL (ref 31.5–35.7)
MCV: 83 fL (ref 79–97)
Monocytes Absolute: 0.3 10*3/uL (ref 0.1–0.9)
Monocytes: 5 %
Neutrophils Absolute: 4.9 10*3/uL (ref 1.4–7.0)
Neutrophils: 64 %
Platelets: 258 10*3/uL (ref 150–450)
RBC: 4.45 x10E6/uL (ref 3.77–5.28)
RDW: 12.7 % (ref 11.7–15.4)
WBC: 7.6 10*3/uL (ref 3.4–10.8)

## 2021-09-10 LAB — LIPID PANEL
Chol/HDL Ratio: 2 ratio (ref 0.0–4.4)
Cholesterol, Total: 148 mg/dL (ref 100–199)
HDL: 74 mg/dL (ref >39)
LDL Chol Calc (NIH): 64 mg/dL (ref 0–99)
Triglycerides: 42 mg/dL (ref 0–149)
VLDL Cholesterol Cal: 10 mg/dL (ref 5–40)

## 2021-09-10 LAB — TSH: TSH: 1.23 u[IU]/mL (ref 0.450–4.500)

## 2021-09-12 LAB — URINE CULTURE

## 2021-09-14 ENCOUNTER — Other Ambulatory Visit: Payer: Self-pay

## 2021-09-14 ENCOUNTER — Other Ambulatory Visit: Payer: Self-pay | Admitting: Internal Medicine

## 2021-09-14 ENCOUNTER — Other Ambulatory Visit
Admission: RE | Admit: 2021-09-14 | Discharge: 2021-09-14 | Disposition: A | Discharge status: Home / Self Care | Payer: 59 | Attending: Internal Medicine | Admitting: Internal Medicine

## 2021-09-14 DIAGNOSIS — R319 Hematuria, unspecified: Secondary | ICD-10-CM

## 2021-09-25 ENCOUNTER — Encounter: Payer: Self-pay | Admitting: Internal Medicine

## 2021-09-30 ENCOUNTER — Other Ambulatory Visit: Payer: Self-pay

## 2021-09-30 ENCOUNTER — Ambulatory Visit (INDEPENDENT_AMBULATORY_CARE_PROVIDER_SITE_OTHER): Payer: BC Managed Care – PPO | Admitting: Internal Medicine

## 2021-09-30 DIAGNOSIS — R319 Hematuria, unspecified: Secondary | ICD-10-CM | POA: Diagnosis not present

## 2021-09-30 LAB — POC URINALYSIS WITH MICROSCOPIC (NON AUTO)MANUAL RESULT
Bacteria, UA: 0
Bilirubin, UA: NEGATIVE
Crystals: 0
Glucose, UA: NEGATIVE
Ketones, UA: NEGATIVE
Mucus, UA: 0
Nitrite, UA: NEGATIVE
Protein, UA: NEGATIVE
RBC: 6 M/uL — AB (ref 4.04–5.48)
Spec Grav, UA: >=1.03 — AB (ref 1.010–1.025)
Urobilinogen, UA: 0.2 E.U./dL
WBC Casts, UA: 0
pH, UA: 6 (ref 5.0–8.0)

## 2021-09-30 NOTE — Progress Notes (Signed)
Urine remains positive for RBCs.  Previous culture was negative.  Will need to refer to Urology.

## 2021-10-11 ENCOUNTER — Ambulatory Visit: Payer: Self-pay | Admitting: Urology

## 2021-10-12 ENCOUNTER — Other Ambulatory Visit: Payer: Self-pay

## 2021-10-12 ENCOUNTER — Encounter: Payer: Self-pay | Admitting: Urology

## 2021-10-12 ENCOUNTER — Ambulatory Visit (INDEPENDENT_AMBULATORY_CARE_PROVIDER_SITE_OTHER): Payer: BC Managed Care – PPO | Admitting: Urology

## 2021-10-12 VITALS — BP 158/82 | HR 81 | Ht 61.0 in | Wt 131.1 lb

## 2021-10-12 DIAGNOSIS — R319 Hematuria, unspecified: Secondary | ICD-10-CM

## 2021-10-12 LAB — MICROSCOPIC EXAMINATION
Bacteria, UA: NONE SEEN
RBC, Urine: NONE SEEN /hpf (ref 0–2)

## 2021-10-12 LAB — URINALYSIS, COMPLETE
Bilirubin, UA: NEGATIVE
Glucose, UA: NEGATIVE
Nitrite, UA: NEGATIVE
RBC, UA: NEGATIVE
Specific Gravity, UA: >1.03 — ABNORMAL HIGH (ref 1.005–1.030)
Urobilinogen, Ur: 1 mg/dL (ref 0.2–1.0)
pH, UA: 5.5 (ref 5.0–7.5)

## 2021-10-12 NOTE — Patient Instructions (Addendum)

## 2021-10-12 NOTE — Addendum Note (Signed)
Addended by: Frankey Shown on: 10/12/2021 01:02 PM   Modules accepted: Orders

## 2021-10-12 NOTE — Progress Notes (Signed)
   10/12/21 10:38 AM   Teresa Rivas 1966/09/24 [id number]  CC: Microscopic hematuria  HPI: 55 year old female with no smoking history or other carcinogenic exposures who was referred from her PCP for microscopic hematuria with 3+ RBCs seen on microscopic analysis.  Urine culture was negative.  She denies any gross hematuria or urinary symptoms.  She has a history of a kidney stone in 2018 that did not require surgery.  Renal function is normal with recent creatinine of 0.69, EGFR greater than 60.  Urinalysis today with 6-10 WBCs, 0 RBCs, no bacteria, nitrite negative, trace leukocytes.  PMH: Past Medical History:  Diagnosis Date   Diabetes mellitus without complication ([place])    History of kidney stones    Hyperlipidemia    Hypertension    Kidney stones 04/27/2017    Surgical History: Past Surgical History:  Procedure Laterality Date   ABDOMINAL HYSTERECTOMY  07/28/2016   total due to fibroids   COLONOSCOPY WITH PROPOFOL N/A 01/21/2021   Procedure: COLONOSCOPY WITH PROPOFOL;  Surgeon: Lesly Rubenstein, MD;  Location: [facility] ENDOSCOPY;  Service: Endoscopy;  Laterality: N/A;   TONSILLECTOMY       Family History: Family History  Problem Relation Age of Onset   Diabetes Mother    Hypertension Mother    Breast cancer Mother 84   Breast cancer Maternal Grandmother    Breast cancer Cousin        pat cousin    Social History:  reports that she has never smoked. She has never used smokeless tobacco. She reports that she does not drink alcohol and does not use drugs.  Physical Exam: BP (!) 158/82 (BP Location: Left Arm, Patient Position: Sitting, Cuff Size: Normal)   Pulse 81   Ht _0  (1.549 m)   Wt 131 lb 1.6 oz (59.5 kg)   LMP  (LMP Unknown)   BMI 24.77 kg/m    Constitutional:  Alert and oriented, No acute distress. Cardiovascular: No clubbing, cyanosis, or edema. Respiratory: Normal respiratory effort, no increased work of breathing. GI: Abdomen is soft,  nontender, nondistended, no abdominal masses \  Laboratory Data: Reviewed, see HPI  Pertinent Imaging: None to review  Assessment & Plan:   55 year old female with episode of 3+ microscopic hematuria on urinalysis, mild pyuria today.  We discussed common possible etiologies of microscopic hematuria including idiopathic, urolithiasis, medical renal disease, and malignancy. We discussed the new asymptomatic microscopic hematuria guidelines and risk categories of low, intermediate, and high risk that are based on age, risk factors like smoking, and degree of microscopic hematuria. We discussed work-up can range from repeat urinalysis, renal ultrasound and cystoscopy, to CT urogram and cystoscopy.  They fall into the high risk category, and I recommended proceeding with CT and cystoscopy.    Nickolas Madrid, MD 10/12/2021  [facility] [address] [place],  [address] [phone]  '

## 2021-10-17 LAB — CULTURE, URINE COMPREHENSIVE

## 2021-11-01 ENCOUNTER — Ambulatory Visit
Admission: RE | Admit: 2021-11-01 | Discharge: 2021-11-01 | Disposition: A | Discharge status: Home / Self Care | Payer: BC Managed Care – PPO | Source: Ambulatory Visit | Attending: Urology | Admitting: Urology

## 2021-11-01 ENCOUNTER — Other Ambulatory Visit: Payer: Self-pay

## 2021-11-01 DIAGNOSIS — N898 Other specified noninflammatory disorders of vagina: Secondary | ICD-10-CM | POA: Diagnosis not present

## 2021-11-01 DIAGNOSIS — K3189 Other diseases of stomach and duodenum: Secondary | ICD-10-CM | POA: Diagnosis not present

## 2021-11-01 DIAGNOSIS — R319 Hematuria, unspecified: Secondary | ICD-10-CM | POA: Insufficient documentation

## 2021-11-01 DIAGNOSIS — R3129 Other microscopic hematuria: Secondary | ICD-10-CM | POA: Diagnosis not present

## 2021-11-01 DIAGNOSIS — N200 Calculus of kidney: Secondary | ICD-10-CM | POA: Diagnosis not present

## 2021-11-01 LAB — POCT I-STAT CREATININE: Creatinine, Ser: 0.8 mg/dL (ref 0.44–1.00)

## 2021-11-01 MED ORDER — IOHEXOL 300 MG/ML  SOLN
100.0000 mL | Freq: Once | INTRAMUSCULAR | Status: AC | PRN
  Administered 2021-11-01: 100 mL via INTRAVENOUS

## 2021-11-02 ENCOUNTER — Encounter: Payer: Self-pay | Admitting: Urology

## 2021-11-02 ENCOUNTER — Ambulatory Visit: Payer: 59 | Admitting: Urology

## 2021-11-02 VITALS — BP 138/84 | HR 81 | Ht 61.0 in | Wt 131.0 lb

## 2021-11-02 DIAGNOSIS — R3121 Asymptomatic microscopic hematuria: Secondary | ICD-10-CM

## 2021-11-02 DIAGNOSIS — R310 Gross hematuria: Secondary | ICD-10-CM | POA: Diagnosis not present

## 2021-11-02 NOTE — Progress Notes (Signed)
Cystoscopy Procedure Note:  Indication: Microscopic hematuria  On pelvic exam, easily palpable 3 cm Bartholin cyst in the left side, nontender, no drainage.  She reports this has been stable long-term and was previously offered incision and drainage by GYN and she deferred.  Return precautions discussed regarding this lesion.  After informed consent and discussion of the procedure and its risks, Teresa Rivas was positioned and prepped in the standard fashion. Cystoscopy was performed with a flexible cystoscope. The urethra, bladder neck and entire bladder was visualized in a standard fashion. The ureteral orifices were visualized in their normal location and orientation.  Bladder mucosa normal throughout, no abnormalities on retroflexion  Imaging: Bilateral nonobstructing 4 mm renal stones, left-sided 3 cm Bartholin cyst  Findings: Normal cystoscopy  Assessment and Plan: Follow-up with urology as needed  Legrand Rams, MD 11/02/2021

## 2022-01-10 ENCOUNTER — Other Ambulatory Visit: Payer: Self-pay

## 2022-01-10 ENCOUNTER — Encounter: Payer: Self-pay | Admitting: Internal Medicine

## 2022-01-10 ENCOUNTER — Ambulatory Visit (INDEPENDENT_AMBULATORY_CARE_PROVIDER_SITE_OTHER): Payer: BC Managed Care – PPO | Admitting: Internal Medicine

## 2022-01-10 VITALS — BP 124/88 | HR 93 | Ht 61.0 in | Wt 133.0 lb

## 2022-01-10 DIAGNOSIS — E118 Type 2 diabetes mellitus with unspecified complications: Secondary | ICD-10-CM | POA: Diagnosis not present

## 2022-01-10 DIAGNOSIS — I10 Essential (primary) hypertension: Secondary | ICD-10-CM

## 2022-01-10 DIAGNOSIS — E782 Mixed hyperlipidemia: Secondary | ICD-10-CM | POA: Diagnosis not present

## 2022-01-10 DIAGNOSIS — M7711 Lateral epicondylitis, right elbow: Secondary | ICD-10-CM | POA: Diagnosis not present

## 2022-01-10 LAB — POCT GLYCOSYLATED HEMOGLOBIN (HGB A1C): Hemoglobin A1C: 6.2 % — AB (ref 4.0–5.6)

## 2022-01-10 MED ORDER — METFORMIN HCL 1000 MG PO TABS: 1000.0000 mg | ORAL_TABLET | Freq: Two times a day (BID) | ORAL | 3 refills | Status: DC

## 2022-01-10 MED ORDER — IBUPROFEN 800 MG PO TABS: 800.0000 mg | ORAL_TABLET | Freq: Three times a day (TID) | ORAL | 0 refills | Status: DC | PRN

## 2022-01-10 MED ORDER — RYBELSUS 3 MG PO TABS: 1.0000 | ORAL_TABLET | Freq: Every day | ORAL | 12 refills | Status: DC

## 2022-01-10 MED ORDER — LISINOPRIL 5 MG PO TABS: 5.0000 mg | ORAL_TABLET | Freq: Every day | ORAL | 3 refills | Status: DC

## 2022-01-10 MED ORDER — ROSUVASTATIN CALCIUM 10 MG PO TABS: 10.0000 mg | ORAL_TABLET | Freq: Every day | ORAL | 3 refills | Status: DC

## 2022-01-10 NOTE — Progress Notes (Signed)
Date:  01/10/2022   Name:  Teresa Rivas   DOB:  [birth year]   MRN:  [id number]   Chief Complaint: Diabetes and Hypertension  Diabetes She presents for her follow-up diabetic visit. She has type 2 diabetes mellitus. Her disease course has been stable. Pertinent negatives for hypoglycemia include no headaches or tremors. Pertinent negatives for diabetes include no chest pain, no fatigue, no polydipsia and no polyuria. Current diabetic treatment includes oral agent (dual therapy) (metformin, rybelsus). She is compliant with treatment all of the time. Her breakfast blood glucose is taken between 6-7 am. Her breakfast blood glucose range is generally 110-130 mg/dl. An ACE inhibitor/angiotensin II receptor blocker is being taken.  Hypertension This is a chronic problem. The problem is controlled. Pertinent negatives include no chest pain, headaches, palpitations or shortness of breath.   Lab Results  Component Value Date   NA 144 09/09/2021   K 4.3 09/09/2021   CO2 27 09/09/2021   GLUCOSE 119 (H) 09/09/2021   BUN 15 09/09/2021   CREATININE 0.80 11/01/2021   CALCIUM 9.9 09/09/2021   EGFR 102 09/09/2021   GFRNONAA >60 09/06/2020   Lab Results  Component Value Date   CHOL 148 09/09/2021   HDL 74 09/09/2021   LDLCALC 64 09/09/2021   TRIG 42 09/09/2021   CHOLHDL 2.0 09/09/2021   Lab Results  Component Value Date   TSH 1.230 09/09/2021   Lab Results  Component Value Date   HGBA1C 6.2 (A) 01/10/2022   Lab Results  Component Value Date   WBC 7.6 09/09/2021   HGB 11.9 09/09/2021   HCT 37.1 09/09/2021   MCV 83 09/09/2021   PLT 258 09/09/2021   Lab Results  Component Value Date   ALT 14 09/09/2021   AST 19 09/09/2021   ALKPHOS 69 09/09/2021   BILITOT 0.3 09/09/2021   Lab Results  Component Value Date   VD25OH 22.49 (L) 09/06/2020     Review of Systems  Constitutional:  Negative for appetite change, fatigue, fever and unexpected weight change.  HENT:   Negative for tinnitus and trouble swallowing.   Eyes:  Negative for visual disturbance.  Respiratory:  Negative for cough, chest tightness and shortness of breath.   Cardiovascular:  Negative for chest pain, palpitations and leg swelling.  Gastrointestinal:  Negative for abdominal pain.  Endocrine: Negative for polydipsia and polyuria.  Genitourinary:  Negative for dysuria and hematuria.       Night sweats  Musculoskeletal:  Negative for arthralgias.  Neurological:  Negative for tremors, numbness and headaches.  Psychiatric/Behavioral:  Negative for dysphoric mood.    Patient Active Problem List   Diagnosis Date Noted   Hematuria 09/09/2021   S/P total hysterectomy and bilateral salpingo-oophorectomy 09/05/2020   Epicondylitis, lateral, right 09/26/2017   Type II diabetes mellitus with complication ([place]) 65/53/7482   Hyperlipidemia associated with type 2 diabetes mellitus ([place]) 05/22/2017   Essential hypertension 05/22/2017   Renal stones 05/22/2017    Allergies  Allergen Reactions   Codeine     Past Surgical History:  Procedure Laterality Date   ABDOMINAL HYSTERECTOMY  07/28/2016   total due to fibroids   COLONOSCOPY WITH PROPOFOL N/A 01/21/2021   Procedure: COLONOSCOPY WITH PROPOFOL;  Surgeon: Lesly Rubenstein, MD;  Location: [facility] ENDOSCOPY;  Service: Endoscopy;  Laterality: N/A;   TONSILLECTOMY      Social History   Tobacco Use   Smoking status: Never   Smokeless tobacco: Never  Vaping Use   Vaping  Use: Never used  Substance Use Topics   Alcohol use: No   Drug use: No     Medication list has been reviewed and updated.  Current Meds  Medication Sig   glucose blood test strip 1 each by Other route as needed for other. Use as instructed   methocarbamol (ROBAXIN) 500 MG tablet Take 1 tablet (500 mg total) by mouth at bedtime. (Patient taking differently: Take 500 mg by mouth as needed.)   VITAMIN D PO Take by mouth daily. Gummies   [DISCONTINUED] ibuprofen  (ADVIL,MOTRIN) 800 MG tablet Take 1 tablet (800 mg total) by mouth every 8 (eight) hours as needed.   [DISCONTINUED] lisinopril (ZESTRIL) 5 MG tablet Take 1 tablet (5 mg total) by mouth daily.   [DISCONTINUED] metFORMIN (GLUCOPHAGE) 1000 MG tablet Take 1 tablet (1,000 mg total) by mouth 2 (two) times daily with a meal.   [DISCONTINUED] rosuvastatin (CRESTOR) 10 MG tablet Take 1 tablet (10 mg total) by mouth daily.   [DISCONTINUED] Semaglutide (RYBELSUS) 3 MG TABS Take 1 tablet by mouth daily.    PHQ 2/9 Scores 01/10/2022 09/09/2021 08/03/2021 01/13/2021  PHQ - 2 Score 0 0 0 0  PHQ- 9 Score 0 0 0 0    GAD 7 : Generalized Anxiety Score 01/10/2022 09/09/2021 08/03/2021 01/13/2021  Nervous, Anxious, on Edge 0 0 0 0  Control/stop worrying 0 0 0 0  Worry too much - different things 0 0 0 0  Trouble relaxing 0 0 0 0  Restless 0 0 0 0  Easily annoyed or irritable 0 0 0 0  Afraid - awful might happen 0 0 0 0  Total GAD 7 Score 0 0 0 0  Anxiety Difficulty Not difficult at all Not difficult at all - Not difficult at all    BP Readings from Last 3 Encounters:  01/10/22 124/88  11/02/21 138/84  10/12/21 (!) 158/82    Physical Exam Vitals and nursing note reviewed.  Constitutional:      General: She is not in acute distress.    Appearance: She is well-developed.  HENT:     Head: Normocephalic and atraumatic.  Cardiovascular:     Rate and Rhythm: Normal rate and regular rhythm.     Pulses: Normal pulses.  Pulmonary:     Effort: Pulmonary effort is normal. No respiratory distress.     Breath sounds: No wheezing or rhonchi.  Musculoskeletal:        General: Normal range of motion.  Skin:    General: Skin is warm and dry.     Capillary Refill: Capillary refill takes less than 2 seconds.     Findings: No rash.  Neurological:     General: No focal deficit present.     Mental Status: She is alert and oriented to person, place, and time.  Psychiatric:        Mood and Affect: Mood normal.         Behavior: Behavior normal.    Wt Readings from Last 3 Encounters:  01/10/22 133 lb (60.3 kg)  11/02/21 131 lb (59.4 kg)  10/12/21 131 lb 1.6 oz (59.5 kg)    BP 124/88    Pulse 93    Ht 5' 1"  (1.549 m)    Wt 133 lb (60.3 kg)    LMP  (LMP Unknown)    SpO2 98%    BMI 25.13 kg/m   Assessment and Plan: 1. Type II diabetes mellitus with complication (HCC) Clinically stable by exam and  report without s/s of hypoglycemia. DM complicated by hypertension and dyslipidemia. Tolerating medications well without side effects or other concerns. - Microalbumin / creatinine urine ratio - POCT glycosylated hemoglobin (Hb A1C)= 6.2 up from 5.9 - metFORMIN (GLUCOPHAGE) 1000 MG tablet; Take 1 tablet (1,000 mg total) by mouth 2 (two) times daily with a meal.  Dispense: 180 tablet; Refill: 3 - Semaglutide (RYBELSUS) 3 MG TABS; Take 1 tablet by mouth daily.  Dispense: 30 tablet; Refill: 12  2. Essential hypertension Clinically stable exam with well controlled BP. Tolerating medications without side effects at this time. Pt to continue current regimen and low sodium diet; benefits of regular exercise as able discussed. - lisinopril (ZESTRIL) 5 MG tablet; Take 1 tablet (5 mg total) by mouth daily.  Dispense: 90 tablet; Refill: 3  3. Epicondylitis, lateral, right Mild intermittent sx - ibuprofen (ADVIL) 800 MG tablet; Take 1 tablet (800 mg total) by mouth every 8 (eight) hours as needed.  Dispense: 90 tablet; Refill: 0  4. Mixed hyperlipidemia Doing well on Crestor - rosuvastatin (CRESTOR) 10 MG tablet; Take 1 tablet (10 mg total) by mouth daily.  Dispense: 90 tablet; Refill: 3   Partially dictated using Editor, commissioning. Any errors are unintentional.  Halina Maidens, MD [facility]  01/10/2022

## 2022-01-11 LAB — MICROALBUMIN / CREATININE URINE RATIO
Creatinine, Urine: 148.8 mg/dL
Microalb/Creat Ratio: 11 mg/g creat (ref 0–29)
Microalbumin, Urine: 16.3 ug/mL

## 2022-05-09 ENCOUNTER — Encounter: Payer: Self-pay | Admitting: Internal Medicine

## 2022-05-09 ENCOUNTER — Ambulatory Visit: Payer: BC Managed Care – PPO | Admitting: Internal Medicine

## 2022-05-09 VITALS — BP 134/76 | HR 90 | Ht 61.0 in | Wt 135.0 lb

## 2022-05-09 DIAGNOSIS — M7071 Other bursitis of hip, right hip: Secondary | ICD-10-CM

## 2022-05-09 DIAGNOSIS — I10 Essential (primary) hypertension: Secondary | ICD-10-CM | POA: Diagnosis not present

## 2022-05-09 DIAGNOSIS — E118 Type 2 diabetes mellitus with unspecified complications: Secondary | ICD-10-CM | POA: Diagnosis not present

## 2022-05-09 LAB — POCT GLYCOSYLATED HEMOGLOBIN (HGB A1C): Hemoglobin A1C: 6.1 % — AB (ref 4.0–5.6)

## 2022-05-09 NOTE — Patient Instructions (Signed)
Please schedule your Mammogram  Please schedule a Diabetic Annual Eye exam.

## 2022-05-09 NOTE — Progress Notes (Signed)
Date:  05/09/2022   Name:  Teresa Rivas   DOB:  September 27, 1966   MRN:  [id number]   Chief Complaint: Diabetes and Hypertension  Diabetes She presents for her follow-up diabetic visit. She has type 2 diabetes mellitus. Pertinent negatives for hypoglycemia include no headaches, nervousness/anxiousness or tremors. Pertinent negatives for diabetes include no chest pain, no fatigue, no polydipsia and no polyuria. Symptoms are stable. Current diabetic treatment includes oral agent (dual therapy) (rybelsus and metformin). She is compliant with treatment all of the time.  Hypertension This is a chronic problem. The problem is controlled. Pertinent negatives include no chest pain, headaches, palpitations or shortness of breath. Past treatments include ACE inhibitors.  Hip Pain  There was no injury mechanism. The pain is present in the right hip. The quality of the pain is described as aching. The pain is mild. The pain has been Fluctuating since onset. Pertinent negatives include no numbness. She has tried nothing for the symptoms.    Lab Results  Component Value Date   NA 144 09/09/2021   K 4.3 09/09/2021   CO2 27 09/09/2021   GLUCOSE 119 (H) 09/09/2021   BUN 15 09/09/2021   CREATININE 0.80 11/01/2021   CALCIUM 9.9 09/09/2021   EGFR 102 09/09/2021   GFRNONAA >60 09/06/2020   Lab Results  Component Value Date   CHOL 148 09/09/2021   HDL 74 09/09/2021   LDLCALC 64 09/09/2021   TRIG 42 09/09/2021   CHOLHDL 2.0 09/09/2021   Lab Results  Component Value Date   TSH 1.230 09/09/2021   Lab Results  Component Value Date   HGBA1C 6.2 (A) 01/10/2022   Lab Results  Component Value Date   WBC 7.6 09/09/2021   HGB 11.9 09/09/2021   HCT 37.1 09/09/2021   MCV 83 09/09/2021   PLT 258 09/09/2021   Lab Results  Component Value Date   ALT 14 09/09/2021   AST 19 09/09/2021   ALKPHOS 69 09/09/2021   BILITOT 0.3 09/09/2021   Lab Results  Component Value Date   VD25OH 22.49  (L) 09/06/2020     Review of Systems  Constitutional:  Negative for appetite change, fatigue, fever and unexpected weight change.  HENT:  Negative for tinnitus and trouble swallowing.   Eyes:  Negative for visual disturbance.  Respiratory:  Negative for cough, chest tightness and shortness of breath.   Cardiovascular:  Negative for chest pain, palpitations and leg swelling.  Gastrointestinal:  Negative for abdominal pain.  Endocrine: Negative for polydipsia and polyuria.  Genitourinary:  Negative for dysuria and hematuria.  Musculoskeletal:  Positive for arthralgias (right hip).  Neurological:  Negative for tremors, numbness and headaches.  Psychiatric/Behavioral:  Positive for sleep disturbance. Negative for dysphoric mood. The patient is not nervous/anxious.     Patient Active Problem List   Diagnosis Date Noted   Hematuria 09/09/2021   S/P total hysterectomy and bilateral salpingo-oophorectomy 09/05/2020   Epicondylitis, lateral, right 09/26/2017   Type II diabetes mellitus with complication ([place]) 10/93/2355   Hyperlipidemia associated with type 2 diabetes mellitus ([place]) 05/22/2017   Essential hypertension 05/22/2017   Renal stones 05/22/2017    Allergies  Allergen Reactions   Codeine     Past Surgical History:  Procedure Laterality Date   ABDOMINAL HYSTERECTOMY  07/28/2016   total due to fibroids   COLONOSCOPY WITH PROPOFOL N/A 01/21/2021   Procedure: COLONOSCOPY WITH PROPOFOL;  Surgeon: Lesly Rubenstein, MD;  Location: [facility] ENDOSCOPY;  Service: Endoscopy;  Laterality:  N/A;   TONSILLECTOMY      Social History   Tobacco Use   Smoking status: Never   Smokeless tobacco: Never  Vaping Use   Vaping Use: Never used  Substance Use Topics   Alcohol use: No   Drug use: No     Medication list has been reviewed and updated.  Current Meds  Medication Sig   glucose blood test strip 1 each by Other route as needed for other. Use as instructed   ibuprofen (ADVIL)  800 MG tablet Take 1 tablet (800 mg total) by mouth every 8 (eight) hours as needed.   lisinopril (ZESTRIL) 5 MG tablet Take 1 tablet (5 mg total) by mouth daily.   metFORMIN (GLUCOPHAGE) 1000 MG tablet Take 1 tablet (1,000 mg total) by mouth 2 (two) times daily with a meal.   methocarbamol (ROBAXIN) 500 MG tablet Take 1 tablet (500 mg total) by mouth at bedtime. (Patient taking differently: Take 500 mg by mouth as needed.)   rosuvastatin (CRESTOR) 10 MG tablet Take 1 tablet (10 mg total) by mouth daily.   Semaglutide (RYBELSUS) 3 MG TABS Take 1 tablet by mouth daily.   VITAMIN D PO Take by mouth daily. Gummies       01/10/2022    9:16 AM 09/09/2021   10:42 AM 08/03/2021    9:59 AM 01/13/2021    9:57 AM  GAD 7 : Generalized Anxiety Score  Nervous, Anxious, on Edge 0 0 0 0  Control/stop worrying 0 0 0 0  Worry too much - different things 0 0 0 0  Trouble relaxing 0 0 0 0  Restless 0 0 0 0  Easily annoyed or irritable 0 0 0 0  Afraid - awful might happen 0 0 0 0  Total GAD 7 Score 0 0 0 0  Anxiety Difficulty Not difficult at all Not difficult at all  Not difficult at all       01/10/2022    9:16 AM  Depression screen PHQ 2/9  Decreased Interest 0  Down, Depressed, Hopeless 0  PHQ - 2 Score 0  Altered sleeping 0  Tired, decreased energy 0  Change in appetite 0  Feeling bad or failure about yourself  0  Trouble concentrating 0  Moving slowly or fidgety/restless 0  Suicidal thoughts 0  PHQ-9 Score 0  Difficult doing work/chores Not difficult at all    BP Readings from Last 3 Encounters:  05/09/22 134/76  01/10/22 124/88  11/02/21 138/84    Physical Exam Vitals and nursing note reviewed.  Constitutional:      General: She is not in acute distress.    Appearance: Normal appearance. She is well-developed.  HENT:     Head: Normocephalic and atraumatic.  Neck:     Vascular: No carotid bruit.  Cardiovascular:     Rate and Rhythm: Normal rate and regular rhythm.     Heart  sounds: No murmur heard. Pulmonary:     Effort: Pulmonary effort is normal. No respiratory distress.     Breath sounds: No wheezing or rhonchi.  Musculoskeletal:     Cervical back: Normal range of motion.     Right hip: Tenderness present. No bony tenderness. Normal range of motion.     Left hip: No tenderness or bony tenderness. Normal range of motion.     Right lower leg: No edema.     Left lower leg: No edema.  Lymphadenopathy:     Cervical: No cervical adenopathy.  Skin:  General: Skin is warm and dry.     Findings: No rash.  Neurological:     Mental Status: She is alert and oriented to person, place, and time.     Sensory: Sensation is intact.     Motor: No weakness.     Deep Tendon Reflexes:     Reflex Scores:      Patellar reflexes are 1+ on the right side and 1+ on the left side. Psychiatric:        Mood and Affect: Mood normal.        Behavior: Behavior normal.     Wt Readings from Last 3 Encounters:  05/09/22 135 lb (61.2 kg)  01/10/22 133 lb (60.3 kg)  11/02/21 131 lb (59.4 kg)    BP 134/76 (BP Location: Left Arm, Cuff Size: Large)   Pulse 90   Ht _0  (1.549 m)   Wt 135 lb (61.2 kg)   LMP  (LMP Unknown)   SpO2 100%   BMI 25.51 kg/m   Assessment and Plan: 1. Type II diabetes mellitus with complication (HCC) Clinically stable by exam and report without s/s of hypoglycemia. DM complicated by hypertension and dyslipidemia. Tolerating medications well without side effects or other concerns. - POCT glycosylated hemoglobin (Hb A1C)= 6.1 down from 6.2  2. Essential hypertension Clinically stable exam with well controlled BP. Tolerating medications without side effects at this time. Pt to continue current regimen and low sodium diet; benefits of regular exercise as able discussed.  3. Bursitis of right hip, unspecified bursa Recommend Advil 800 mg tid for 7 days then PRN Heat to right lateral hip   Partially dictated using Editor, commissioning. Any errors  are unintentional.  Halina Maidens, MD [facility]  05/09/2022

## 2022-09-11 ENCOUNTER — Encounter: Payer: Self-pay | Admitting: Internal Medicine

## 2022-09-11 ENCOUNTER — Telehealth: Payer: Self-pay

## 2022-09-11 ENCOUNTER — Ambulatory Visit (INDEPENDENT_AMBULATORY_CARE_PROVIDER_SITE_OTHER): Payer: BC Managed Care – PPO | Admitting: Internal Medicine

## 2022-09-11 VITALS — BP 134/86 | HR 89 | Ht 61.0 in | Wt 136.0 lb

## 2022-09-11 DIAGNOSIS — Z1231 Encounter for screening mammogram for malignant neoplasm of breast: Secondary | ICD-10-CM

## 2022-09-11 DIAGNOSIS — I10 Essential (primary) hypertension: Secondary | ICD-10-CM | POA: Diagnosis not present

## 2022-09-11 DIAGNOSIS — E1169 Type 2 diabetes mellitus with other specified complication: Secondary | ICD-10-CM | POA: Diagnosis not present

## 2022-09-11 DIAGNOSIS — R300 Dysuria: Secondary | ICD-10-CM | POA: Diagnosis not present

## 2022-09-11 DIAGNOSIS — E785 Hyperlipidemia, unspecified: Secondary | ICD-10-CM

## 2022-09-11 DIAGNOSIS — Z7984 Long term (current) use of oral hypoglycemic drugs: Secondary | ICD-10-CM | POA: Diagnosis not present

## 2022-09-11 DIAGNOSIS — Z0000 Encounter for general adult medical examination without abnormal findings: Secondary | ICD-10-CM | POA: Diagnosis not present

## 2022-09-11 DIAGNOSIS — E118 Type 2 diabetes mellitus with unspecified complications: Secondary | ICD-10-CM | POA: Diagnosis not present

## 2022-09-11 DIAGNOSIS — Z23 Encounter for immunization: Secondary | ICD-10-CM | POA: Diagnosis not present

## 2022-09-11 LAB — POC URINALYSIS WITH MICROSCOPIC (NON AUTO)MANUAL RESULT
Bilirubin, UA: NEGATIVE
Crystals: 0
Epithelial cells, urine per micros: 0
Glucose, UA: NEGATIVE
Ketones, UA: NEGATIVE
Mucus, UA: 0
Nitrite, UA: NEGATIVE
Protein, UA: NEGATIVE
RBC: 0 M/uL — AB (ref 4.04–5.48)
Spec Grav, UA: 1.015 (ref 1.010–1.025)
Urobilinogen, UA: 0.2 E.U./dL
WBC Casts, UA: 10
pH, UA: 6 (ref 5.0–8.0)

## 2022-09-11 LAB — HEPATIC FUNCTION PANEL
ALT: 9 U/L (ref 7–35)
AST: 19 (ref 13–35)
Alkaline Phosphatase: 75 (ref 25–125)
Bilirubin, Total: 0.3

## 2022-09-11 LAB — COMPREHENSIVE METABOLIC PANEL
Albumin: 4.6 (ref 3.5–5.0)
Calcium: 10.2 (ref 8.7–10.7)
Globulin: 2.3
eGFR: 86

## 2022-09-11 LAB — BASIC METABOLIC PANEL
BUN: 14 (ref 4–21)
CO2: 26 — AB (ref 13–22)
Chloride: 102 (ref 99–108)
Creatinine: 0.8 (ref 0.5–1.1)
Glucose: 94
Potassium: 4.7 mEq/L (ref 3.5–5.1)
Sodium: 141 (ref 137–147)

## 2022-09-11 LAB — LIPID PANEL
Cholesterol: 143 (ref 0–200)
HDL: 67 (ref 35–70)
LDL Cholesterol: 65
Triglycerides: 47 (ref 40–160)

## 2022-09-11 LAB — CBC AND DIFFERENTIAL
HCT: 38 (ref 36–46)
Hemoglobin: 12.3 (ref 12.0–16.0)
Platelets: 244 10*3/uL (ref 150–400)
WBC: 6.6

## 2022-09-11 LAB — HEMOGLOBIN A1C: Hemoglobin A1C: 6.9

## 2022-09-11 LAB — TSH: TSH: 1.63 (ref 0.41–5.90)

## 2022-09-11 LAB — CBC: RBC: 4.64 (ref 3.87–5.11)

## 2022-09-11 MED ORDER — SULFAMETHOXAZOLE-TRIMETHOPRIM 800-160 MG PO TABS: 1.0000 | ORAL_TABLET | Freq: Two times a day (BID) | ORAL | 0 refills | Status: AC

## 2022-09-11 MED ORDER — LISINOPRIL 10 MG PO TABS: 10.0000 mg | ORAL_TABLET | Freq: Every day | ORAL | 0 refills | Status: DC

## 2022-09-11 NOTE — Telephone Encounter (Signed)
Called pt to inform her urine shows infection. Bactrim antibiotics sent to [facility]. Left VM.  - Neddie Steedman

## 2022-09-11 NOTE — Progress Notes (Signed)
Date:  09/11/2022   Name:  Teresa Rivas   DOB:  Apr 12, 1966   MRN:  [id number]   Chief Complaint: Annual Exam (Breast exam no pap) and Urinary Tract Infection Teresa Rivas is a 56 y.o. female who presents today for her Complete Annual Exam. She feels well. She reports exercising walking daily. She reports she is sleeping well. Breast complaints none.  Under some stress from her 74 d/o daughter who is struggling with drug abuse and stage IV CKD and lives in [place].  Mammogram: 03/2021 DEXA: none Pap smear: discontinued Colonoscopy: 12/2020 repeat 10 yrs  Health Maintenance Due  Topic Date Due   Zoster Vaccines- Shingrix (1 of 2) Never done   OPHTHALMOLOGY EXAM  02/15/2022   MAMMOGRAM  04/20/2022   Diabetic kidney evaluation - GFR measurement  09/09/2022    Immunization History  Administered Date(s) Administered   Influenza,inj,Quad PF,6+ Mos 09/02/2019, 09/09/2021   Influenza-Unspecified 09/02/2019   Moderna Sars-Covid-2 Vaccination 07/15/2020, 08/12/2020   Pneumococcal Conjugate-13 01/29/2014    Hypertension This is a chronic problem. The problem is controlled. Pertinent negatives include no chest pain, headaches, palpitations or shortness of breath. Past treatments include ACE inhibitors. The current treatment provides significant improvement. There are no compliance problems.   Diabetes She presents for her follow-up diabetic visit. She has type 2 diabetes mellitus. Pertinent negatives for hypoglycemia include no dizziness, headaches, nervousness/anxiousness or tremors. Pertinent negatives for diabetes include no chest pain, no fatigue, no polydipsia and no polyuria. Current diabetic treatment includes oral agent (dual therapy) (metformin and rrybelsus 3 mg). She is compliant with treatment all of the time. An ACE inhibitor/angiotensin II receptor blocker is being taken. Eye exam is not current.  Hyperlipidemia This is a chronic problem. The  problem is controlled. Pertinent negatives include no chest pain or shortness of breath. Current antihyperlipidemic treatment includes statins.    Lab Results  Component Value Date   NA 144 09/09/2021   K 4.3 09/09/2021   CO2 27 09/09/2021   GLUCOSE 119 (H) 09/09/2021   BUN 15 09/09/2021   CREATININE 0.80 11/01/2021   CALCIUM 9.9 09/09/2021   EGFR 102 09/09/2021   GFRNONAA >60 09/06/2020   Lab Results  Component Value Date   CHOL 148 09/09/2021   HDL 74 09/09/2021   LDLCALC 64 09/09/2021   TRIG 42 09/09/2021   CHOLHDL 2.0 09/09/2021   Lab Results  Component Value Date   TSH 1.230 09/09/2021   Lab Results  Component Value Date   HGBA1C 6.1 (A) 05/09/2022   Lab Results  Component Value Date   WBC 7.6 09/09/2021   HGB 11.9 09/09/2021   HCT 37.1 09/09/2021   MCV 83 09/09/2021   PLT 258 09/09/2021   Lab Results  Component Value Date   ALT 14 09/09/2021   AST 19 09/09/2021   ALKPHOS 69 09/09/2021   BILITOT 0.3 09/09/2021   Lab Results  Component Value Date   VD25OH 22.49 (L) 09/06/2020     Review of Systems  Constitutional:  Negative for chills, fatigue and fever.  HENT:  Negative for congestion, hearing loss, tinnitus, trouble swallowing and voice change.   Eyes:  Negative for visual disturbance.  Respiratory:  Negative for cough, chest tightness, shortness of breath and wheezing.   Cardiovascular:  Negative for chest pain, palpitations and leg swelling.  Gastrointestinal:  Negative for abdominal pain, constipation, diarrhea and vomiting.  Endocrine: Negative for polydipsia and polyuria.  Genitourinary:  Negative for dysuria,  frequency, genital sores, vaginal bleeding and vaginal discharge.  Musculoskeletal:  Negative for arthralgias, gait problem and joint swelling.  Skin:  Negative for color change and rash.  Neurological:  Negative for dizziness, tremors, light-headedness and headaches.  Hematological:  Negative for adenopathy. Does not bruise/bleed  easily.  Psychiatric/Behavioral:  Negative for dysphoric mood and sleep disturbance. The patient is not nervous/anxious.     Patient Active Problem List   Diagnosis Date Noted   Hematuria 09/09/2021   S/P total hysterectomy and bilateral salpingo-oophorectomy 09/05/2020   Epicondylitis, lateral, right 09/26/2017   Type II diabetes mellitus with complication ([place]) 69/45/0388   Hyperlipidemia associated with type 2 diabetes mellitus ([place]) 05/22/2017   Essential hypertension 05/22/2017   Renal stones 05/22/2017    Allergies  Allergen Reactions   Codeine     Past Surgical History:  Procedure Laterality Date   ABDOMINAL HYSTERECTOMY  07/28/2016   total due to fibroids   COLONOSCOPY WITH PROPOFOL N/A 01/21/2021   Procedure: COLONOSCOPY WITH PROPOFOL;  Surgeon: Lesly Rubenstein, MD;  Location: [facility] ENDOSCOPY;  Service: Endoscopy;  Laterality: N/A;   TONSILLECTOMY      Social History   Tobacco Use   Smoking status: Never   Smokeless tobacco: Never  Vaping Use   Vaping Use: Never used  Substance Use Topics   Alcohol use: No   Drug use: No     Medication list has been reviewed and updated.  Current Meds  Medication Sig   glucose blood test strip 1 each by Other route as needed for other. Use as instructed   ibuprofen (ADVIL) 800 MG tablet Take 1 tablet (800 mg total) by mouth every 8 (eight) hours as needed.   metFORMIN (GLUCOPHAGE) 1000 MG tablet Take 1 tablet (1,000 mg total) by mouth 2 (two) times daily with a meal.   methocarbamol (ROBAXIN) 500 MG tablet Take 1 tablet (500 mg total) by mouth at bedtime. (Patient taking differently: Take 500 mg by mouth as needed.)   rosuvastatin (CRESTOR) 10 MG tablet Take 1 tablet (10 mg total) by mouth daily.   Semaglutide (RYBELSUS) 3 MG TABS Take 1 tablet by mouth daily.   [DISCONTINUED] lisinopril (ZESTRIL) 5 MG tablet Take 1 tablet (5 mg total) by mouth daily.       09/11/2022   10:50 AM 01/10/2022    9:16 AM 09/09/2021    10:42 AM 08/03/2021    9:59 AM  GAD 7 : Generalized Anxiety Score  Nervous, Anxious, on Edge 0 0 0 0  Control/stop worrying 0 0 0 0  Worry too much - different things 0 0 0 0  Trouble relaxing 0 0 0 0  Restless 0 0 0 0  Easily annoyed or irritable 0 0 0 0  Afraid - awful might happen 0 0 0 0  Total GAD 7 Score 0 0 0 0  Anxiety Difficulty Not difficult at all Not difficult at all Not difficult at all        09/11/2022   10:50 AM 01/10/2022    9:16 AM 09/09/2021   10:42 AM  Depression screen PHQ 2/9  Decreased Interest 0 0 0  Down, Depressed, Hopeless 0 0 0  PHQ - 2 Score 0 0 0  Altered sleeping 0 0 0  Tired, decreased energy 0 0 0  Change in appetite 0 0 0  Feeling bad or failure about yourself  0 0 0  Trouble concentrating 0 0 0  Moving slowly or fidgety/restless 0 0 0  Suicidal thoughts 0 0 0  PHQ-9 Score 0 0 0  Difficult doing work/chores Not difficult at all Not difficult at all Not difficult at all    BP Readings from Last 3 Encounters:  09/11/22 134/86  05/09/22 134/76  01/10/22 124/88    Physical Exam Vitals and nursing note reviewed.  Constitutional:      General: She is not in acute distress.    Appearance: She is well-developed.  HENT:     Head: Normocephalic and atraumatic.     Right Ear: Tympanic membrane and ear canal normal.     Left Ear: Tympanic membrane and ear canal normal.     Nose:     Right Sinus: No maxillary sinus tenderness.     Left Sinus: No maxillary sinus tenderness.  Eyes:     General: No scleral icterus.       Right eye: No discharge.        Left eye: No discharge.     Conjunctiva/sclera: Conjunctivae normal.  Neck:     Thyroid: No thyromegaly.     Vascular: No carotid bruit.  Cardiovascular:     Rate and Rhythm: Normal rate and regular rhythm.     Pulses: Normal pulses.     Heart sounds: Normal heart sounds.  Pulmonary:     Effort: Pulmonary effort is normal. No respiratory distress.     Breath sounds: No wheezing.  Chest:   Breasts:    Right: No mass, nipple discharge, skin change or tenderness.     Left: No mass, nipple discharge, skin change or tenderness.  Abdominal:     General: Bowel sounds are normal.     Palpations: Abdomen is soft.     Tenderness: There is no abdominal tenderness.  Musculoskeletal:     Cervical back: Normal range of motion. No erythema.     Right lower leg: No edema.     Left lower leg: No edema.  Lymphadenopathy:     Cervical: No cervical adenopathy.  Skin:    General: Skin is warm and dry.     Findings: No rash.  Neurological:     Mental Status: She is alert and oriented to person, place, and time.     Cranial Nerves: No cranial nerve deficit.     Sensory: No sensory deficit.     Deep Tendon Reflexes: Reflexes are normal and symmetric.  Psychiatric:        Attention and Perception: Attention normal.        Mood and Affect: Mood normal.     Wt Readings from Last 3 Encounters:  09/11/22 136 lb (61.7 kg)  05/09/22 135 lb (61.2 kg)  01/10/22 133 lb (60.3 kg)    BP 134/86 (BP Location: Left Arm, Cuff Size: Normal)   Pulse 89   Ht 5' 1"  (1.549 m)   Wt 136 lb (61.7 kg)   LMP  (LMP Unknown)   SpO2 98%   BMI 25.70 kg/m   Assessment and Plan: 1. Annual physical exam Normal exam. Up to date on screenings and immunizations. - CBC with Differential/Platelet - Comprehensive metabolic panel - Hemoglobin A1c - Lipid panel - TSH  2. Encounter for screening mammogram for breast cancer scheduled  3. Essential hypertension BP higher than desired - will increase Lisinopril to 10 mg and recheck in 6 weeks - CBC with Differential/Platelet - TSH - lisinopril (ZESTRIL) 10 MG tablet; Take 1 tablet (10 mg total) by mouth daily.  Dispense: 90 tablet; Refill: 0  4. Type II diabetes mellitus with complication ([place]) Clinically stable by exam and report without s/s of hypoglycemia. DM complicated by hypertension and dyslipidemia. Tolerating medications well without side  effects or other concerns. - Comprehensive metabolic panel - Hemoglobin A1c - Microalbumin / creatinine urine ratio  5. Hyperlipidemia associated with type 2 diabetes mellitus ([place]) Tolerating statin medication without side effects at this time LDL is at goal of < 70 on current dose Continue same therapy without change at this time. - Lipid panel  6. Dysuria UA is positive for WBC and bacteria. Will order Bactrim DS bid x 7 days. - POC urinalysis w microscopic (non auto)   Partially dictated using Editor, commissioning. Any errors are unintentional.  Halina Maidens, MD [facility]  09/11/2022

## 2022-09-12 LAB — MICROALBUMIN / CREATININE URINE RATIO
Creatinine, Urine: 142.5 mg/dL
Microalb/Creat Ratio: 21 mg/g creat (ref 0–29)
Microalbumin, Urine: 30.1 ug/mL

## 2022-09-25 ENCOUNTER — Ambulatory Visit
Admission: RE | Admit: 2022-09-25 | Discharge: 2022-09-25 | Disposition: A | Discharge status: Home / Self Care | Payer: BC Managed Care – PPO | Source: Ambulatory Visit | Attending: Internal Medicine | Admitting: Internal Medicine

## 2022-09-25 DIAGNOSIS — Z1231 Encounter for screening mammogram for malignant neoplasm of breast: Secondary | ICD-10-CM | POA: Insufficient documentation

## 2022-10-16 ENCOUNTER — Telehealth: Payer: Self-pay

## 2022-10-17 ENCOUNTER — Encounter: Payer: Self-pay | Admitting: Internal Medicine

## 2022-10-17 ENCOUNTER — Ambulatory Visit: Payer: BC Managed Care – PPO | Admitting: Internal Medicine

## 2022-10-17 ENCOUNTER — Telehealth: Payer: Self-pay | Admitting: Internal Medicine

## 2022-10-17 VITALS — BP 126/72 | HR 91 | Ht 61.0 in | Wt 132.0 lb

## 2022-10-17 DIAGNOSIS — R1013 Epigastric pain: Secondary | ICD-10-CM

## 2022-10-17 DIAGNOSIS — E118 Type 2 diabetes mellitus with unspecified complications: Secondary | ICD-10-CM

## 2022-10-17 DIAGNOSIS — I10 Essential (primary) hypertension: Secondary | ICD-10-CM | POA: Diagnosis not present

## 2022-10-17 MED ORDER — LISINOPRIL 10 MG PO TABS: 10.0000 mg | ORAL_TABLET | Freq: Every day | ORAL | 1 refills | Status: DC

## 2022-10-17 NOTE — Progress Notes (Signed)
Date:  10/17/2022   Name:  Teresa Rivas   DOB:  20-Mar-1966   MRN:  [id number]   Chief Complaint: Hypertension  Hypertension This is a chronic problem. The problem has been gradually improving since onset. The problem is controlled. Pertinent negatives include no chest pain, headaches, palpitations or shortness of breath. Past treatments include ACE inhibitors (increased dose of lisinopril last visit). The current treatment provides significant improvement. There are no compliance problems.   Gastroesophageal Reflux She complains of abdominal pain and belching. She reports no chest pain, no choking, no dysphagia, no tooth decay or no water brash. This is a recurrent problem. The problem occurs frequently. The problem has been unchanged. Pertinent negatives include no fatigue. She has tried an antacid for the symptoms. The treatment provided mild relief. Past procedures do not include an abdominal ultrasound, an EGD or a UGI.    Lab Results  Component Value Date   NA 141 09/11/2022   K 4.7 09/11/2022   CO2 26 (A) 09/11/2022   GLUCOSE 119 (H) 09/09/2021   BUN 14 09/11/2022   CREATININE 0.8 09/11/2022   CALCIUM 10.2 09/11/2022   EGFR 86 09/11/2022   GFRNONAA >60 09/06/2020   Lab Results  Component Value Date   CHOL 143 09/11/2022   HDL 67 09/11/2022   LDLCALC 65 09/11/2022   TRIG 47 09/11/2022   CHOLHDL 2.0 09/09/2021   Lab Results  Component Value Date   TSH 1.63 09/11/2022   Lab Results  Component Value Date   HGBA1C 6.9 09/11/2022   Lab Results  Component Value Date   WBC 6.6 09/11/2022   HGB 12.3 09/11/2022   HCT 38 09/11/2022   MCV 83 09/09/2021   PLT 244 09/11/2022   Lab Results  Component Value Date   ALT 9 09/11/2022   AST 19 09/11/2022   ALKPHOS 75 09/11/2022   BILITOT 0.3 09/09/2021   Lab Results  Component Value Date   VD25OH 22.49 (L) 09/06/2020     Review of Systems  Constitutional:  Negative for chills, fatigue, fever and  unexpected weight change.  Respiratory:  Negative for choking and shortness of breath.   Cardiovascular:  Negative for chest pain and palpitations.  Gastrointestinal:  Positive for abdominal pain. Negative for constipation, diarrhea, dysphagia and vomiting.  Neurological:  Negative for dizziness and headaches.  Psychiatric/Behavioral:  Negative for dysphoric mood and sleep disturbance. The patient is not nervous/anxious.     Patient Active Problem List   Diagnosis Date Noted   Hematuria 09/09/2021   S/P total hysterectomy and bilateral salpingo-oophorectomy 09/05/2020   Epicondylitis, lateral, right 09/26/2017   Type II diabetes mellitus with complication ([place]) 81/11/7508   Hyperlipidemia associated with type 2 diabetes mellitus ([place]) 05/22/2017   Essential hypertension 05/22/2017   Renal stones 05/22/2017    Allergies  Allergen Reactions   Codeine     Past Surgical History:  Procedure Laterality Date   ABDOMINAL HYSTERECTOMY  07/28/2016   total due to fibroids   COLONOSCOPY WITH PROPOFOL N/A 01/21/2021   Procedure: COLONOSCOPY WITH PROPOFOL;  Surgeon: Lesly Rubenstein, MD;  Location: [facility] ENDOSCOPY;  Service: Endoscopy;  Laterality: N/A;   TONSILLECTOMY      Social History   Tobacco Use   Smoking status: Never   Smokeless tobacco: Never  Vaping Use   Vaping Use: Never used  Substance Use Topics   Alcohol use: No   Drug use: No     Medication list has been reviewed  and updated.  Current Meds  Medication Sig   glucose blood test strip 1 each by Other route as needed for other. Use as instructed   ibuprofen (ADVIL) 800 MG tablet Take 1 tablet (800 mg total) by mouth every 8 (eight) hours as needed.   lisinopril (ZESTRIL) 10 MG tablet Take 1 tablet (10 mg total) by mouth daily.   metFORMIN (GLUCOPHAGE) 1000 MG tablet Take 1 tablet (1,000 mg total) by mouth 2 (two) times daily with a meal.   methocarbamol (ROBAXIN) 500 MG tablet Take 1 tablet (500 mg total) by  mouth at bedtime. (Patient taking differently: Take 500 mg by mouth as needed.)   rosuvastatin (CRESTOR) 10 MG tablet Take 1 tablet (10 mg total) by mouth daily.   Semaglutide (RYBELSUS) 3 MG TABS Take 1 tablet by mouth daily.   VITAMIN D PO Take by mouth daily. Gummies       10/17/2022    2:43 PM 09/11/2022   10:50 AM 01/10/2022    9:16 AM 09/09/2021   10:42 AM  GAD 7 : Generalized Anxiety Score  Nervous, Anxious, on Edge 0 0 0 0  Control/stop worrying 0 0 0 0  Worry too much - different things 0 0 0 0  Trouble relaxing 0 0 0 0  Restless 0 0 0 0  Easily annoyed or irritable 0 0 0 0  Afraid - awful might happen 0 0 0 0  Total GAD 7 Score 0 0 0 0  Anxiety Difficulty Not difficult at all Not difficult at all Not difficult at all Not difficult at all       10/17/2022    2:43 PM 09/11/2022   10:50 AM 01/10/2022    9:16 AM  Depression screen PHQ 2/9  Decreased Interest 0 0 0  Down, Depressed, Hopeless 0 0 0  PHQ - 2 Score 0 0 0  Altered sleeping 0 0 0  Tired, decreased energy 0 0 0  Change in appetite 0 0 0  Feeling bad or failure about yourself  0 0 0  Trouble concentrating 0 0 0  Moving slowly or fidgety/restless 0 0 0  Suicidal thoughts 0 0 0  PHQ-9 Score 0 0 0  Difficult doing work/chores Not difficult at all Not difficult at all Not difficult at all    BP Readings from Last 3 Encounters:  10/17/22 138/78  09/11/22 134/86  05/09/22 134/76    Physical Exam Vitals and nursing note reviewed.  Constitutional:      General: She is not in acute distress.    Appearance: Normal appearance. She is well-developed.  HENT:     Head: Normocephalic and atraumatic.  Cardiovascular:     Rate and Rhythm: Normal rate and regular rhythm.  Pulmonary:     Effort: Pulmonary effort is normal. No respiratory distress.     Breath sounds: No wheezing or rhonchi.  Abdominal:     General: Abdomen is flat. Bowel sounds are normal.     Palpations: Abdomen is soft.  Musculoskeletal:      Cervical back: Normal range of motion.     Right lower leg: No edema.     Left lower leg: No edema.  Skin:    General: Skin is warm and dry.     Findings: No rash.  Neurological:     Mental Status: She is alert and oriented to person, place, and time.  Psychiatric:        Mood and Affect: Mood normal.  Behavior: Behavior normal.     Wt Readings from Last 3 Encounters:  10/17/22 132 lb (59.9 kg)  09/11/22 136 lb (61.7 kg)  05/09/22 135 lb (61.2 kg)    BP 138/78 (BP Location: Right Arm, Patient Position: Sitting, Cuff Size: Normal)   Pulse 91   Ht _0  (1.549 m)   Wt 132 lb (59.9 kg)   LMP  (LMP Unknown)   SpO2 98%   BMI 24.94 kg/m   Assessment and Plan: 1. Essential hypertension Clinically stable exam with well controlled BP on higher dose lisinopril. Tolerating medications without side effects at this time. Pt to continue current regimen and low sodium diet; benefits of regular exercise as able discussed. - lisinopril (ZESTRIL) 10 MG tablet; Take 1 tablet (10 mg total) by mouth daily.  Dispense: 90 tablet; Refill: 1  2. Type II diabetes mellitus with complication (HCC) Clinically stable by exam and report without s/s of hypoglycemia. DM complicated by hypertension and dyslipidemia. Tolerating medications well without side effects or other concerns.  3. Dyspepsia Continue otc antacids as needed - Ambulatory referral to Gastroenterology   Partially dictated using [place]. Any errors are unintentional.  Halina Maidens, MD [employer]  10/17/2022

## 2022-10-17 NOTE — Telephone Encounter (Signed)
Called pt as a reminder to get her labs done. Pt stated she had her labs done. Pt stated she had already had a bill from [facility]. I called [facility] they stated that they don't have any results. Called pt back and told her to call [facility] before she went to get her labs done again so she doesn't get charged twice for her abs. Pt verbalized understanding and will call [facility] to see what needs to be done.  KP

## 2022-10-17 NOTE — Telephone Encounter (Signed)
Copied from CRM [id number]. Topic: General - Other >> Oct 17, 2022  1:07 PM Macon Large wrote: Reason for CRM: Pt reports that [employer] will be faxing over her lab results from 09/11/22.

## 2022-10-17 NOTE — Telephone Encounter (Signed)
Noted  KP 

## 2023-01-12 ENCOUNTER — Ambulatory Visit: Payer: BC Managed Care – PPO | Admitting: Internal Medicine

## 2023-01-13 ENCOUNTER — Other Ambulatory Visit: Payer: Self-pay | Admitting: Internal Medicine

## 2023-01-13 DIAGNOSIS — E782 Mixed hyperlipidemia: Secondary | ICD-10-CM

## 2023-01-13 DIAGNOSIS — E118 Type 2 diabetes mellitus with unspecified complications: Secondary | ICD-10-CM

## 2023-02-13 ENCOUNTER — Other Ambulatory Visit: Payer: Self-pay | Admitting: Internal Medicine

## 2023-02-13 DIAGNOSIS — E118 Type 2 diabetes mellitus with unspecified complications: Secondary | ICD-10-CM

## 2023-02-13 NOTE — Telephone Encounter (Signed)
Requested Prescriptions  Pending Prescriptions Disp Refills   RYBELSUS 3 MG TABS [Pharmacy Med Name: Rybelsus 3 MG Oral Tablet] 30 tablet 2    Sig: Take 1 tablet by mouth once daily     Off-Protocol Failed - 02/13/2023  6:52 AM      Failed - Medication not assigned to a protocol, review manually.      Passed - Valid encounter within last 12 months    Recent Outpatient Visits           3 months ago Essential hypertension   [place] at [facility], Jesse Sans, MD   5 months ago Annual physical exam   [place] at [facility], Jesse Sans, MD   9 months ago Type II diabetes mellitus with complication [facility])   [place] [facility] at [facility], Jesse Sans, MD   1 year ago Type II diabetes mellitus with complication [facility])   [place] [facility] at [facility], Jesse Sans, MD   1 year ago Hematuria, unspecified type   [facility] at [facility], Jesse Sans, MD       Future Appointments             In 7 months [facility] Melia, Jesse Sans, MD [place] at [facility], [facility]

## 2023-03-28 ENCOUNTER — Other Ambulatory Visit: Payer: Self-pay | Admitting: Internal Medicine

## 2023-03-28 DIAGNOSIS — M7711 Lateral epicondylitis, right elbow: Secondary | ICD-10-CM

## 2023-04-09 ENCOUNTER — Ambulatory Visit
Admission: EM | Admit: 2023-04-09 | Discharge: 2023-04-09 | Disposition: A | Discharge status: Home / Self Care | Payer: BC Managed Care – PPO | Attending: Emergency Medicine | Admitting: Emergency Medicine

## 2023-04-09 DIAGNOSIS — I10 Essential (primary) hypertension: Secondary | ICD-10-CM | POA: Insufficient documentation

## 2023-04-09 DIAGNOSIS — E119 Type 2 diabetes mellitus without complications: Secondary | ICD-10-CM | POA: Insufficient documentation

## 2023-04-09 DIAGNOSIS — Z7984 Long term (current) use of oral hypoglycemic drugs: Secondary | ICD-10-CM | POA: Insufficient documentation

## 2023-04-09 DIAGNOSIS — J069 Acute upper respiratory infection, unspecified: Secondary | ICD-10-CM

## 2023-04-09 DIAGNOSIS — Z1152 Encounter for screening for COVID-19: Secondary | ICD-10-CM | POA: Insufficient documentation

## 2023-04-09 DIAGNOSIS — R059 Cough, unspecified: Secondary | ICD-10-CM | POA: Diagnosis not present

## 2023-04-09 DIAGNOSIS — B9789 Other viral agents as the cause of diseases classified elsewhere: Secondary | ICD-10-CM | POA: Diagnosis not present

## 2023-04-09 LAB — SARS CORONAVIRUS 2 BY RT PCR: SARS Coronavirus 2 by RT PCR: NEGATIVE

## 2023-04-09 LAB — GROUP A STREP BY PCR: Group A Strep by PCR: NOT DETECTED

## 2023-04-09 MED ORDER — BENZONATATE 100 MG PO CAPS: 200.0000 mg | ORAL_CAPSULE | Freq: Three times a day (TID) | ORAL | 0 refills | Status: DC

## 2023-04-09 MED ORDER — IPRATROPIUM BROMIDE 0.06 % NA SOLN: 2.0000 | Freq: Four times a day (QID) | NASAL | 12 refills | Status: AC

## 2023-04-09 MED ORDER — PROMETHAZINE-DM 6.25-15 MG/5ML PO SYRP: 5.0000 mL | ORAL_SOLUTION | Freq: Four times a day (QID) | ORAL | 0 refills | Status: DC | PRN

## 2023-04-09 NOTE — Discharge Instructions (Signed)
Your testing today was negative for both COVID and strep.  I believe you have a viral respiratory infection which is causing your symptoms.  Rest and push fluids so that your body can heal.  Use over-the-counter Tylenol and/or ibuprofen according to the package instructions as needed for pain or fever.  You may gargle with warm salt water to help soothe your throat.  You may also use over-the-counter Chloraseptic or Sucrets lozenges as needed for throat pain.  No more than 1 lozenge every 2 hours as the menthol may give you diarrhea.  Use the Atrovent nasal spray, 2 squirts in each nostril every 6 hours, as needed for runny nose and postnasal drip.  Use the Tessalon Perles every 8 hours during the day.  Take them with a small sip of water.  They may give you some numbness to the base of your tongue or a metallic taste in your mouth, this is normal.  Use the Promethazine DM cough syrup at bedtime for cough and congestion.  It will make you drowsy so do not take it during the day.  Return for reevaluation or see your primary care provider for any new or worsening symptoms.

## 2023-04-09 NOTE — ED Triage Notes (Signed)
Pt c/o cough,HA & nasal congestion x4 days, fevers since Saturday around 99.8-100.2. Has tried nyquil,ibuprofen & tea w/honey w/o relief.

## 2023-04-09 NOTE — ED Provider Notes (Signed)
[facility]    CSN: [id number] Arrival date & time: 04/09/23  0857      History   Chief Complaint Chief Complaint  Patient presents with   Cough   Nasal Congestion    HPI Teresa Rivas is a 57 y.o. adult.   HPI  57 year old female with a past medical history significant for type 2 diabetes, hypertension, and hyperlipidemia presents for evaluation of 4 days worth of COVID/flulike symptoms.  She reports that she has had ongoing fevers with a Tmax of 100.2.  Nasal congestion with clear nasal discharge, she had a sore throat on Saturday, fatigue, and a cough that is intermittently productive for light yellow sputum.  She denies any shortness of breath or wheezing.  She also denies body aches or known sick contacts but she does endorse a headache.  Past Medical History:  Diagnosis Date   Diabetes mellitus without complication (HCC)    History of kidney stones    Hyperlipidemia    Hypertension    Kidney stones 04/27/2017    Patient Active Problem List   Diagnosis Date Noted   Hematuria 09/09/2021   S/P total hysterectomy and bilateral salpingo-oophorectomy 09/05/2020   Epicondylitis, lateral, right 09/26/2017   Type II diabetes mellitus with complication (HCC) 05/22/2017   Hyperlipidemia associated with type 2 diabetes mellitus (HCC) 05/22/2017   Essential hypertension 05/22/2017   Renal stones 05/22/2017    Past Surgical History:  Procedure Laterality Date   ABDOMINAL HYSTERECTOMY  07/28/2016   total due to fibroids   COLONOSCOPY WITH PROPOFOL N/A 01/21/2021   Procedure: COLONOSCOPY WITH PROPOFOL;  Surgeon: Regis Bill, MD;  Location: [facility] ENDOSCOPY;  Service: Endoscopy;  Laterality: N/A;   TONSILLECTOMY      OB History   No obstetric history on file.      Home Medications    Prior to Admission medications   Medication Sig Start Date End Date Taking? Authorizing Provider  benzonatate (TESSALON) 100 MG capsule Take 2 capsules  (200 mg total) by mouth every 8 (eight) hours. 04/09/23  Yes Becky Augusta, NP  glucose blood test strip 1 each by Other route as needed for other. Use as instructed   Yes [provider]  ibuprofen (ADVIL) 800 MG tablet TAKE 1 TABLET BY MOUTH EVERY 8 HOURS AS NEEDED 03/29/23  Yes Reubin Milan, MD  ipratropium (ATROVENT) 0.06 % nasal spray Place 2 sprays into both nostrils 4 (four) times daily. 04/09/23  Yes Becky Augusta, NP  lisinopril (ZESTRIL) 10 MG tablet Take 1 tablet (10 mg total) by mouth daily. 10/17/22  Yes Reubin Milan, MD  metFORMIN (GLUCOPHAGE) 1000 MG tablet TAKE 1 TABLET BY MOUTH TWICE DAILY WITH MEALS 01/14/23  Yes Reubin Milan, MD  methocarbamol (ROBAXIN) 500 MG tablet Take 1 tablet (500 mg total) by mouth at bedtime. Patient taking differently: Take 500 mg by mouth as needed. 01/13/21  Yes Reubin Milan, MD  promethazine-dextromethorphan (PROMETHAZINE-DM) 6.25-15 MG/5ML syrup Take 5 mLs by mouth 4 (four) times daily as needed. 04/09/23  Yes Becky Augusta, NP  rosuvastatin (CRESTOR) 10 MG tablet Take 1 tablet by mouth once daily 01/14/23  Yes Reubin Milan, MD  Semaglutide [facility]) 3 MG TABS Take 1 tablet by mouth once daily 02/13/23  Yes Reubin Milan, MD  VITAMIN D PO Take by mouth daily. Gummies   Yes [provider]    Family History Family History  Problem Relation Age of Onset   Diabetes Mother  Hypertension Mother    Breast cancer Mother 7   Breast cancer Maternal Grandmother    Breast cancer Cousin        pat cousin    Social History Social History   Tobacco Use   Smoking status: Never   Smokeless tobacco: Never  Vaping Use   Vaping Use: Never used  Substance Use Topics   Alcohol use: No   Drug use: No     Allergies   Codeine   Review of Systems Review of Systems  Constitutional:  Positive for fatigue and fever.  HENT:  Positive for congestion, rhinorrhea and sore throat. Negative for ear pain.    Respiratory:  Positive for cough. Negative for shortness of breath and wheezing.   Musculoskeletal:  Negative for arthralgias and myalgias.  Neurological:  Positive for headaches.     Physical Exam Triage Vital Signs ED Triage Vitals [04/09/23 0944]  Enc Vitals Group     BP      Pulse      Resp 16     Temp      Temp Source Oral     SpO2      Weight      Height      Head Circumference      Peak Flow      Pain Score      Pain Loc      Pain Edu?      Excl. in GC?    No data found.  Updated Vital Signs BP 131/81 (BP Location: Left Arm)   Pulse 90   Temp 98.5 F (36.9 C) (Oral)   Resp 16   Ht 5\' 1"  (1.549 m)   Wt 135 lb (61.2 kg)   LMP  (LMP Unknown)   SpO2 99%   BMI 25.51 kg/m   Visual Acuity Right Eye Distance:   Left Eye Distance:   Bilateral Distance:    Right Eye Near:   Left Eye Near:    Bilateral Near:     Physical Exam Vitals and nursing note reviewed.  Constitutional:      Appearance: Normal appearance. Teresa Rivas is not ill-appearing.  HENT:     Head: Normocephalic and atraumatic.     Ears:     Comments: Both EACs are blocked by cerumen.    Nose: Congestion and rhinorrhea present.     Comments: Nasal mucosa is erythematous and edematous with clear discharge in both nares.    Mouth/Throat:     Mouth: Mucous membranes are moist.     Pharynx: Oropharynx is clear. Posterior oropharyngeal erythema present. No oropharyngeal exudate.     Comments: Patient has erythema to her soft palate bilateral tonsillar pillars with mild edema but no appreciable exudate. Neck:     Comments: Bilateral anterior cervical lymphadenopathy present on exam. Cardiovascular:     Rate and Rhythm: Normal rate and regular rhythm.     Pulses: Normal pulses.     Heart sounds: Normal heart sounds. No murmur heard.    No friction rub. No gallop.  Pulmonary:     Effort: Pulmonary effort is normal.     Breath sounds: Normal breath sounds. No wheezing, rhonchi or  rales.  Musculoskeletal:     Cervical back: Normal range of motion and neck supple.  Lymphadenopathy:     Cervical: Cervical adenopathy present.  Skin:    General: Skin is warm and dry.     Capillary Refill: Capillary refill takes less than 2 seconds.  Findings: No rash.  Neurological:     General: No focal deficit present.     Mental Status: Jaquelin Rzepecki is alert and oriented to person, place, and time.      UC Treatments / Results  Labs (all labs ordered are listed, but only abnormal results are displayed) Labs Reviewed  GROUP A STREP BY PCR  SARS CORONAVIRUS 2 BY RT PCR    EKG   Radiology No results found.  Procedures Procedures (including critical care time)  Medications Ordered in UC Medications - No data to display  Initial Impression / Assessment and Plan / UC Course  I have reviewed the triage vital signs and the nursing notes.  Pertinent labs & imaging results that were available during my care of the patient were reviewed by me and considered in my medical decision making (see chart for details).   Patient is a pleasant, nontoxic-appearing 57 year old female presenting for evaluation of flu/COVID-like symptoms began 4 days ago.  She reports that she has taken 2 home COVID test that were both negative.  She has not had any recent travel or known sick contacts.  She does have inflammation of her upper respiratory tree and her symptomology is concerning for possible influenza or COVID.  Given that she has had symptoms for 4 days I will not order a flu test because she is outside the therapeutic window for Tamiflu but I will order a COVID PCR.  Given her past medical history she does qualify for antiviral therapy should she test positive.  Additionally, she does have erythema to her throat and complained of a sore throat at onset of symptoms so I will also order a strep PCR.  COVID PCR is negative.  Strep PCR is negative.  I will discharge patient  home with a diagnosis of viral URI with cough.  She can use over-the-counter Tylenol and or ibuprofen as needed for pain or fever.  Atrovent nasal spray to help with the nasal congestion and Tessalon Perles and Promethazine DM cough syrup to help with cough and congestion.  Return precautions reviewed.  Work note provided.  Final Clinical Impressions(s) / UC Diagnoses   Final diagnoses:  Viral URI with cough     Discharge Instructions      Your testing today was negative for both COVID and strep.  I believe you have a viral respiratory infection which is causing your symptoms.  Rest and push fluids so that your body can heal.  Use over-the-counter Tylenol and/or ibuprofen according to the package instructions as needed for pain or fever.  You may gargle with warm salt water to help soothe your throat.  You may also use over-the-counter Chloraseptic or Sucrets lozenges as needed for throat pain.  No more than 1 lozenge every 2 hours as the menthol may give you diarrhea.  Use the Atrovent nasal spray, 2 squirts in each nostril every 6 hours, as needed for runny nose and postnasal drip.  Use the Tessalon Perles every 8 hours during the day.  Take them with a small sip of water.  They may give you some numbness to the base of your tongue or a metallic taste in your mouth, this is normal.  Use the Promethazine DM cough syrup at bedtime for cough and congestion.  It will make you drowsy so do not take it during the day.  Return for reevaluation or see your primary care provider for any new or worsening symptoms.      ED  Prescriptions     Medication Sig Dispense Auth. Provider   benzonatate (TESSALON) 100 MG capsule Take 2 capsules (200 mg total) by mouth every 8 (eight) hours. 21 capsule Becky Augusta, NP   ipratropium (ATROVENT) 0.06 % nasal spray Place 2 sprays into both nostrils 4 (four) times daily. 15 mL Becky Augusta, NP   promethazine-dextromethorphan (PROMETHAZINE-DM) 6.25-15  MG/5ML syrup Take 5 mLs by mouth 4 (four) times daily as needed. 118 mL Becky Augusta, NP      PDMP not reviewed this encounter.   Becky Augusta, NP 04/09/23 1057

## 2023-04-10 ENCOUNTER — Ambulatory Visit
Admission: RE | Admit: 2023-04-10 | Discharge: 2023-04-10 | Disposition: A | Discharge status: Home / Self Care | Payer: BC Managed Care – PPO | Source: Ambulatory Visit | Attending: Emergency Medicine | Admitting: Emergency Medicine

## 2023-04-10 ENCOUNTER — Ambulatory Visit: Payer: Self-pay

## 2023-04-10 ENCOUNTER — Emergency Department
Admission: EM | Admit: 2023-04-10 | Discharge: 2023-04-10 | Disposition: A | Discharge status: Home / Self Care | Payer: BC Managed Care – PPO | Attending: Emergency Medicine | Admitting: Emergency Medicine

## 2023-04-10 ENCOUNTER — Emergency Department: Payer: BC Managed Care – PPO

## 2023-04-10 ENCOUNTER — Other Ambulatory Visit: Payer: Self-pay

## 2023-04-10 VITALS — BP 147/73 | HR 76 | Temp 98.2°F | Resp 18

## 2023-04-10 DIAGNOSIS — N200 Calculus of kidney: Secondary | ICD-10-CM | POA: Diagnosis not present

## 2023-04-10 DIAGNOSIS — Z20822 Contact with and (suspected) exposure to covid-19: Secondary | ICD-10-CM | POA: Diagnosis not present

## 2023-04-10 DIAGNOSIS — R112 Nausea with vomiting, unspecified: Secondary | ICD-10-CM

## 2023-04-10 DIAGNOSIS — R1031 Right lower quadrant pain: Secondary | ICD-10-CM | POA: Diagnosis not present

## 2023-04-10 DIAGNOSIS — N132 Hydronephrosis with renal and ureteral calculous obstruction: Secondary | ICD-10-CM | POA: Diagnosis not present

## 2023-04-10 DIAGNOSIS — I10 Essential (primary) hypertension: Secondary | ICD-10-CM | POA: Diagnosis not present

## 2023-04-10 DIAGNOSIS — E119 Type 2 diabetes mellitus without complications: Secondary | ICD-10-CM | POA: Diagnosis not present

## 2023-04-10 LAB — URINALYSIS, ROUTINE W REFLEX MICROSCOPIC
Bilirubin Urine: NEGATIVE
Glucose, UA: NEGATIVE mg/dL
Ketones, ur: 80 mg/dL — AB
Nitrite: NEGATIVE
Protein, ur: 30 mg/dL — AB
RBC / HPF: >50 RBC/hpf (ref 0–5)
Specific Gravity, Urine: 1.023 (ref 1.005–1.030)
pH: 7 (ref 5.0–8.0)

## 2023-04-10 LAB — COMPREHENSIVE METABOLIC PANEL
ALT: 17 U/L (ref 0–44)
AST: 26 U/L (ref 15–41)
Albumin: 4.4 g/dL (ref 3.5–5.0)
Alkaline Phosphatase: 55 U/L (ref 38–126)
Anion gap: 11 (ref 5–15)
BUN: 18 mg/dL (ref 6–20)
CO2: 23 mmol/L (ref 22–32)
Calcium: 9.7 mg/dL (ref 8.9–10.3)
Chloride: 106 mmol/L (ref 98–111)
Creatinine, Ser: 0.91 mg/dL (ref 0.44–1.00)
GFR, Estimated: >60 mL/min (ref >60)
Glucose, Bld: 118 mg/dL — ABNORMAL HIGH (ref 70–99)
Potassium: 3.8 mmol/L (ref 3.5–5.1)
Sodium: 140 mmol/L (ref 135–145)
Total Bilirubin: 0.7 mg/dL (ref 0.3–1.2)
Total Protein: 7.7 g/dL (ref 6.5–8.1)

## 2023-04-10 LAB — CBC
HCT: 37.9 % (ref 36.0–46.0)
Hemoglobin: 11.6 g/dL — ABNORMAL LOW (ref 12.0–15.0)
MCH: 25.9 pg — ABNORMAL LOW (ref 26.0–34.0)
MCHC: 30.6 g/dL (ref 30.0–36.0)
MCV: 84.6 fL (ref 80.0–100.0)
Platelets: 228 10*3/uL (ref 150–400)
RBC: 4.48 MIL/uL (ref 3.87–5.11)
RDW: 13 % (ref 11.5–15.5)
WBC: 9 10*3/uL (ref 4.0–10.5)
nRBC: 0 % (ref 0.0–0.2)

## 2023-04-10 LAB — LIPASE, BLOOD: Lipase: 28 U/L (ref 11–51)

## 2023-04-10 LAB — POCT FASTING CBG KUC MANUAL ENTRY: POCT Glucose (KUC): 141 mg/dL — AB (ref 70–99)

## 2023-04-10 LAB — SARS CORONAVIRUS 2 BY RT PCR: SARS Coronavirus 2 by RT PCR: NEGATIVE

## 2023-04-10 MED ORDER — TAMSULOSIN HCL 0.4 MG PO CAPS: 0.4000 mg | ORAL_CAPSULE | Freq: Every day | ORAL | 0 refills | Status: DC

## 2023-04-10 MED ORDER — KETOROLAC TROMETHAMINE 30 MG/ML IJ SOLN
30.0000 mg | Freq: Once | INTRAMUSCULAR | Status: AC
  Administered 2023-04-10: 30 mg via INTRAVENOUS
  Filled 2023-04-10: qty 1

## 2023-04-10 MED ORDER — OXYCODONE-ACETAMINOPHEN 5-325 MG PO TABS: 1.0000 | ORAL_TABLET | ORAL | 0 refills | Status: DC | PRN

## 2023-04-10 MED ORDER — IOHEXOL 300 MG/ML  SOLN
100.0000 mL | Freq: Once | INTRAMUSCULAR | Status: AC | PRN
  Administered 2023-04-10: 100 mL via INTRAVENOUS

## 2023-04-10 MED ORDER — SODIUM CHLORIDE 0.9 % IV BOLUS
1000.0000 mL | Freq: Once | INTRAVENOUS | Status: AC
  Administered 2023-04-10: 1000 mL via INTRAVENOUS

## 2023-04-10 MED ORDER — MORPHINE SULFATE (PF) 4 MG/ML IV SOLN
4.0000 mg | Freq: Once | INTRAVENOUS | Status: AC
  Administered 2023-04-10: 4 mg via INTRAVENOUS
  Filled 2023-04-10: qty 1

## 2023-04-10 MED ORDER — ONDANSETRON HCL 4 MG/2ML IJ SOLN
4.0000 mg | Freq: Once | INTRAMUSCULAR | Status: AC
  Administered 2023-04-10: 4 mg via INTRAVENOUS
  Filled 2023-04-10: qty 2

## 2023-04-10 MED ORDER — CEFDINIR 300 MG PO CAPS: 300.0000 mg | ORAL_CAPSULE | Freq: Two times a day (BID) | ORAL | 0 refills | Status: DC

## 2023-04-10 MED ORDER — KETOROLAC TROMETHAMINE 10 MG PO TABS: 10.0000 mg | ORAL_TABLET | Freq: Four times a day (QID) | ORAL | 0 refills | Status: DC | PRN

## 2023-04-10 NOTE — Discharge Instructions (Signed)
Follow-up with Dr. Nelwyn Salisbury.  If you have not heard from them in 24 hours please call them for an appointment.  Be sure to tell them you are seen in the emergency department.  Take the medications as prescribed.  Pain medication as needed.  Return emergency department if you are worsening

## 2023-04-10 NOTE — ED Triage Notes (Addendum)
Patient to Urgent Care with husband, complaints of nausea/ emesis. Reports lower abdominal cramping. Denies any diarrhea/ urinary symptoms. Reports temp 99 at home.   Reports symptoms worsened today. Has a GI appointment in July d/t ongoing GI issues and GERD.

## 2023-04-10 NOTE — ED Notes (Signed)
Patient is being discharged from the Urgent Care and sent to the Emergency Department via POV . Per Wendee Beavers NP, patient is in need of higher level of care due to RLQ abdominal pain. Patient is aware and verbalizes understanding of plan of care.  Vitals:   04/10/23 1348 04/10/23 1354  BP:  (!) 147/73  Pulse: 76   Resp: 18   Temp: 98.2 F (36.8 C)   SpO2: 98%

## 2023-04-10 NOTE — Discharge Instructions (Addendum)
Go to the emergency department for evaluation of your right lower abdominal pain, nausea, vomiting.

## 2023-04-10 NOTE — ED Provider Notes (Signed)
[facility] Provider Note    Event Date/Time   First MD Initiated Contact with Patient 04/10/23 1441     (approximate)   History   Abdominal Pain   HPI  Teresa Rivas is a 57 y.o. adult me with history of diabetes, hyperlipidemia, hypertension, and kidney stones presents emergency department with history of right lower quadrant pain.  Patient states right lower quadrant pain started over the last 2 to 3 days.  Has been worsening in intensity.  Also has some right upper quadrant pain.  Does still have her gallbladder and appendix.  No diarrhea noted.  Patient was seen at the urgent care and sent to the ED for evaluation.      Physical Exam   Triage Vital Signs: ED Triage Vitals  Enc Vitals Group     BP 04/10/23 1428 (!) 153/89     Pulse Rate 04/10/23 1428 88     Resp 04/10/23 1428 18     Temp 04/10/23 1428 99.1 F (37.3 C)     Temp src --      SpO2 04/10/23 1428 98 %     Weight 04/10/23 1429 134 lb 7.7 oz (61 kg)     Height 04/10/23 1429 5\' 1"  (1.549 m)     Head Circumference --      Peak Flow --      Pain Score 04/10/23 1429 9     Pain Loc --      Pain Edu? --      Excl. in GC? --     Most recent vital signs: Vitals:   04/10/23 1428  BP: (!) 153/89  Pulse: 88  Resp: 18  Temp: 99.1 F (37.3 C)  SpO2: 98%     General: Awake, no distress.   CV:  Good peripheral perfusion. regular rate and  rhythm Resp:  Normal effort. Lungs CTA Abd:  No distention.  Tender in the right upper and right lower quadrant, no rebound tenderness, bowel sounds normal all 4 quadrants Other:      ED Results / Procedures / Treatments   Labs (all labs ordered are listed, but only abnormal results are displayed) Labs Reviewed  COMPREHENSIVE METABOLIC PANEL - Abnormal; Notable for the following components:      Result Value   Glucose, Bld 118 (*)    All other components within normal limits  CBC - Abnormal; Notable for the following  components:   Hemoglobin 11.6 (*)    MCH 25.9 (*)    All other components within normal limits  URINALYSIS, ROUTINE W REFLEX MICROSCOPIC - Abnormal; Notable for the following components:   Color, Urine YELLOW (*)    APPearance HAZY (*)    Hgb urine dipstick MODERATE (*)    Ketones, ur 80 (*)    Protein, ur 30 (*)    Leukocytes,Ua SMALL (*)    Bacteria, UA RARE (*)    All other components within normal limits  SARS CORONAVIRUS 2 BY RT PCR  LIPASE, BLOOD     EKG     RADIOLOGY CT abdomen pelvis with IV contrast    PROCEDURES:   Procedures   MEDICATIONS ORDERED IN ED: Medications  sodium chloride 0.9 % bolus 1,000 mL (1,000 mLs Intravenous New Bag/Given 04/10/23 1555)  morphine (PF) 4 MG/ML injection 4 mg (4 mg Intravenous Given 04/10/23 1556)  ondansetron (ZOFRAN) injection 4 mg (4 mg Intravenous Given 04/10/23 1555)  iohexol (OMNIPAQUE) 300 MG/ML solution 100 mL (100 mLs  Intravenous Contrast Given 04/10/23 1615)  ketorolac (TORADOL) 30 MG/ML injection 30 mg (30 mg Intravenous Given 04/10/23 1653)     IMPRESSION / MDM / ASSESSMENT AND PLAN / ED COURSE  I reviewed the triage vital signs and the nursing notes.                              Differential diagnosis includes, but is not limited to, acute appendicitis, acute cholecystitis, bowel obstruction, acute gastroenteritis, COVID  Patient's presentation is most consistent with acute presentation with potential threat to life or bodily function.   Labs and imaging ordered, patient was given IV, normal saline 1 L IV morphine 4 mg IV and Zofran 4 mg IV for pain and nausea.   Labs are reassuring, although her urine does show small amount of white cells along with moderate hemoglobin and ketones  CT abdomen pelvis IV contrast was independently reviewed and interpreted by me after reading radiologist report as a 5 mm obstructing renal stone.  I did explain the findings to the patient.  She is given Toradol 30 mg IV, 1 L  normal saline, will have her follow-up with urology.  She has seen Dr. Danelle Berry in the past.  Secure message sent to Dr. Lonna Cobb as Lorain Childes for follow-up.  Patient is in agreement treatment plan.  Will give her an antibiotic due to the small amount of leuks and low-grade temp which I do think is more from her upper respiratory infection, however would like to cover the patient so she does not get a infected kidney stone.  She will also be given Toradol and Flomax.  Encouraged her to drink plenty of fluids.  Patient is in agreement with treatment plan.  States she is feeling better after the medications and fluids.  Discharged in stable condition.  Strict instructions to follow-up with urology.  Return if worsening   FINAL CLINICAL IMPRESSION(S) / ED DIAGNOSES   Final diagnoses:  Kidney stone     Rx / DC Orders   ED Discharge Orders          Ordered    ketorolac (TORADOL) 10 MG tablet  Every 6 hours PRN        04/10/23 1703    tamsulosin (FLOMAX) 0.4 MG CAPS capsule  Daily        04/10/23 1703    oxyCODONE-acetaminophen (PERCOCET) 5-325 MG tablet  Every 4 hours PRN        04/10/23 1703    cefdinir (OMNICEF) 300 MG capsule  2 times daily        04/10/23 1703             Note:  This document was prepared using Dragon voice recognition software and may include unintentional dictation errors.    Faythe Ghee, PA-C 04/10/23 1943    Jene Every, MD 04/12/23 [id number]

## 2023-04-10 NOTE — ED Provider Notes (Signed)
Renaldo Fiddler    CSN: [id number] Arrival date & time: 04/10/23  1336      History   Chief Complaint Chief Complaint  Patient presents with   Abdominal Pain    Throwing up - Entered by patient   Emesis   Nausea    HPI Teresa Rivas is a 57 y.o. adult.  Patient presents with abdominal pain, nausea, and vomiting today.  The abdominal pain is cramping and primarily in her right lower abdomen.  She states she has been unable to keep fluids down today.  No diarrhea, constipation, dysuria, hematuria.  She reports 4-5 day history of low grade fever, congestion, runny nose, cough.  Tmax 100.2.  No OTC medications today.  Patient was seen at [facility] urgent care yesterday; diagnosed with viral URI with cough; strep negative, COVID-negative, treated with Tessalon Perles, Atrovent nasal spray, Promethazine DM.  Her medical history includes diabetes, hypertension, kidney stones.  The history is provided by the patient and medical records.    Past Medical History:  Diagnosis Date   Diabetes mellitus without complication (HCC)    History of kidney stones    Hyperlipidemia    Hypertension    Kidney stones 04/27/2017    Patient Active Problem List   Diagnosis Date Noted   Hematuria 09/09/2021   S/P total hysterectomy and bilateral salpingo-oophorectomy 09/05/2020   Epicondylitis, lateral, right 09/26/2017   Type II diabetes mellitus with complication (HCC) 05/22/2017   Hyperlipidemia associated with type 2 diabetes mellitus (HCC) 05/22/2017   Essential hypertension 05/22/2017   Renal stones 05/22/2017    Past Surgical History:  Procedure Laterality Date   ABDOMINAL HYSTERECTOMY  07/28/2016   total due to fibroids   COLONOSCOPY WITH PROPOFOL N/A 01/21/2021   Procedure: COLONOSCOPY WITH PROPOFOL;  Surgeon: Regis Bill, MD;  Location: [facility] ENDOSCOPY;  Service: Endoscopy;  Laterality: N/A;   TONSILLECTOMY      OB History   No obstetric history on file.       Home Medications    Prior to Admission medications   Medication Sig Start Date End Date Taking? Authorizing Provider  benzonatate (TESSALON) 100 MG capsule Take 2 capsules (200 mg total) by mouth every 8 (eight) hours. 04/09/23   Becky Augusta, NP  glucose blood test strip 1 each by Other route as needed for other. Use as instructed    [provider]  ibuprofen (ADVIL) 800 MG tablet TAKE 1 TABLET BY MOUTH EVERY 8 HOURS AS NEEDED 03/29/23   Reubin Milan, MD  ipratropium (ATROVENT) 0.06 % nasal spray Place 2 sprays into both nostrils 4 (four) times daily. 04/09/23   Becky Augusta, NP  lisinopril (ZESTRIL) 10 MG tablet Take 1 tablet (10 mg total) by mouth daily. 10/17/22   Reubin Milan, MD  metFORMIN (GLUCOPHAGE) 1000 MG tablet TAKE 1 TABLET BY MOUTH TWICE DAILY WITH MEALS 01/14/23   Reubin Milan, MD  methocarbamol (ROBAXIN) 500 MG tablet Take 1 tablet (500 mg total) by mouth at bedtime. Patient taking differently: Take 500 mg by mouth as needed. 01/13/21   Reubin Milan, MD  promethazine-dextromethorphan (PROMETHAZINE-DM) 6.25-15 MG/5ML syrup Take 5 mLs by mouth 4 (four) times daily as needed. 04/09/23   Becky Augusta, NP  rosuvastatin (CRESTOR) 10 MG tablet Take 1 tablet by mouth once daily 01/14/23   Reubin Milan, MD  Semaglutide [facility]) 3 MG TABS Take 1 tablet by mouth once daily 02/13/23   Reubin Milan, MD  VITAMIN  D PO Take by mouth daily. Gummies    [provider]    Family History Family History  Problem Relation Age of Onset   Diabetes Mother    Hypertension Mother    Breast cancer Mother 48   Breast cancer Maternal Grandmother    Breast cancer Cousin        pat cousin    Social History Social History   Tobacco Use   Smoking status: Never   Smokeless tobacco: Never  Vaping Use   Vaping Use: Never used  Substance Use Topics   Alcohol use: No   Drug use: No     Allergies   Codeine   Review of Systems Review of Systems   Constitutional:  Positive for activity change, appetite change and fever. Negative for chills.  HENT:  Positive for congestion and rhinorrhea. Negative for ear pain and sore throat.   Respiratory:  Positive for cough. Negative for shortness of breath.   Cardiovascular:  Negative for chest pain and palpitations.  Gastrointestinal:  Positive for abdominal pain, nausea and vomiting. Negative for blood in stool, constipation and diarrhea.  Genitourinary:  Negative for dysuria and hematuria.     Physical Exam Triage Vital Signs ED Triage Vitals  Enc Vitals Group     BP      Pulse      Resp      Temp      Temp src      SpO2      Weight      Height      Head Circumference      Peak Flow      Pain Score      Pain Loc      Pain Edu?      Excl. in GC?    No data found.  Updated Vital Signs BP (!) 147/73   Pulse 76   Temp 98.2 F (36.8 C)   Resp 18   LMP  (LMP Unknown)   SpO2 98%   Visual Acuity Right Eye Distance:   Left Eye Distance:   Bilateral Distance:    Right Eye Near:   Left Eye Near:    Bilateral Near:     Physical Exam Vitals and nursing note reviewed.  Constitutional:      General: Jilene Scism is not in acute distress.    Appearance: Normal appearance. Doxie Donnisha Johnny is well-developed. Alaycia Edina Bible is ill-appearing.  HENT:     Right Ear: Tympanic membrane normal.     Left Ear: Tympanic membrane normal.     Nose: Nose normal.     Mouth/Throat:     Mouth: Mucous membranes are dry.     Pharynx: Oropharynx is clear.  Cardiovascular:     Rate and Rhythm: Normal rate and regular rhythm.     Heart sounds: Normal heart sounds.  Pulmonary:     Effort: Pulmonary effort is normal. No respiratory distress.     Breath sounds: Normal breath sounds.  Abdominal:     General: Bowel sounds are normal.     Palpations: Abdomen is soft.     Tenderness: There is abdominal tenderness in the right lower quadrant and suprapubic area.  There is no right CVA tenderness, left CVA tenderness, guarding or rebound.  Musculoskeletal:     Cervical back: Neck supple.  Skin:    General: Skin is warm and dry.  Neurological:     Mental Status: Syrena Briskey is alert.  Psychiatric:        Mood and Affect: Mood normal.        Behavior: Behavior normal.      UC Treatments / Results  Labs (all labs ordered are listed, but only abnormal results are displayed) Labs Reviewed  POCT FASTING CBG [facility] MANUAL ENTRY - Abnormal; Notable for the following components:      Result Value   POCT Glucose ([facility]) 141 (*)    All other components within normal limits    EKG   Radiology No results found.  Procedures Procedures (including critical care time)  Medications Ordered in UC Medications - No data to display  Initial Impression / Assessment and Plan / UC Course  I have reviewed the triage vital signs and the nursing notes.  Pertinent labs & imaging results that were available during my care of the patient were reviewed by me and considered in my medical decision making (see chart for details).    RLQ abdominal pain, Nausea and vomiting.  Afebrile, VSS.  Patient is tender in her right lower abdomen.  She reports persistent nausea and vomiting today.  She has not been able to keep yourself hydrated at home.  Sending her to the ED for evaluation.  Her husband drove her here and will drive her to the ED.  She declines EMS.  Final Clinical Impressions(s) / UC Diagnoses   Final diagnoses:  Nausea and vomiting, unspecified vomiting type  Right lower quadrant abdominal pain     Discharge Instructions      Go to the emergency department for evaluation of your right lower abdominal pain, nausea, vomiting.     ED Prescriptions   None    PDMP not reviewed this encounter.   Mickie Bail, NP 04/10/23 1416

## 2023-04-10 NOTE — ED Triage Notes (Signed)
Pt to ED for generalized lower abd pain started "awhile ago" worsened this am. +n/v.

## 2023-04-17 ENCOUNTER — Encounter: Payer: Self-pay | Admitting: Internal Medicine

## 2023-04-17 ENCOUNTER — Ambulatory Visit: Payer: BC Managed Care – PPO | Admitting: Internal Medicine

## 2023-04-17 ENCOUNTER — Other Ambulatory Visit: Payer: Self-pay | Admitting: *Deleted

## 2023-04-17 VITALS — BP 118/78 | HR 100 | Temp 98.7°F | Ht 61.0 in | Wt 124.0 lb

## 2023-04-17 DIAGNOSIS — J0100 Acute maxillary sinusitis, unspecified: Secondary | ICD-10-CM

## 2023-04-17 DIAGNOSIS — N200 Calculus of kidney: Secondary | ICD-10-CM

## 2023-04-17 MED ORDER — AZITHROMYCIN 250 MG PO TABS
ORAL_TABLET | ORAL | 0 refills | Status: AC
Start: 2023-04-17 — End: 2023-04-22

## 2023-04-17 MED ORDER — PREDNISONE 10 MG PO TABS
ORAL_TABLET | ORAL | 0 refills | Status: AC
Start: 2023-04-17 — End: 2023-04-22

## 2023-04-17 NOTE — Progress Notes (Signed)
Date:  04/17/2023   Name:  Teresa Rivas   DOB:  [birth year]   MRN:  [id number]   Chief Complaint: Cough (Nasal congestion.)  Cough This is a new problem. Episode onset: 1.5 weeks ago. The problem has been waxing and waning. Associated symptoms include headaches and nasal congestion. Pertinent negatives include no chills, fever (100.1 at home in the beginning.), sore throat, shortness of breath, sweats or wheezing.    Lab Results  Component Value Date   NA 140 04/10/2023   K 3.8 04/10/2023   CO2 23 04/10/2023   GLUCOSE 118 (H) 04/10/2023   BUN 18 04/10/2023   CREATININE 0.91 04/10/2023   CALCIUM 9.7 04/10/2023   EGFR 86 09/11/2022   GFRNONAA >60 04/10/2023   Lab Results  Component Value Date   CHOL 143 09/11/2022   HDL 67 09/11/2022   LDLCALC 65 09/11/2022   TRIG 47 09/11/2022   CHOLHDL 2.0 09/09/2021   Lab Results  Component Value Date   TSH 1.63 09/11/2022   Lab Results  Component Value Date   HGBA1C 6.9 09/11/2022   Lab Results  Component Value Date   WBC 9.0 04/10/2023   HGB 11.6 (L) 04/10/2023   HCT 37.9 04/10/2023   MCV 84.6 04/10/2023   PLT 228 04/10/2023   Lab Results  Component Value Date   ALT 17 04/10/2023   AST 26 04/10/2023   ALKPHOS 55 04/10/2023   BILITOT 0.7 04/10/2023   Lab Results  Component Value Date   VD25OH 22.49 (L) 09/06/2020     Review of Systems  Constitutional:  Negative for chills and fever (100.1 at home in the beginning.).  HENT:  Positive for congestion and sinus pressure. Negative for sore throat and trouble swallowing.   Respiratory:  Positive for cough. Negative for chest tightness, shortness of breath and wheezing.   Gastrointestinal:  Negative for diarrhea, nausea and vomiting.  Neurological:  Positive for headaches.  Psychiatric/Behavioral:  Negative for dysphoric mood and sleep disturbance. The patient is not nervous/anxious.     Patient Active Problem List   Diagnosis Date Noted   Hematuria  09/09/2021   S/P total hysterectomy and bilateral salpingo-oophorectomy 09/05/2020   Epicondylitis, lateral, right 09/26/2017   Type II diabetes mellitus with complication (HCC) 05/22/2017   Hyperlipidemia associated with type 2 diabetes mellitus (HCC) 05/22/2017   Essential hypertension 05/22/2017   Renal stones 05/22/2017    Allergies  Allergen Reactions   Codeine     Past Surgical History:  Procedure Laterality Date   ABDOMINAL HYSTERECTOMY  07/28/2016   total due to fibroids   COLONOSCOPY WITH PROPOFOL N/A 01/21/2021   Procedure: COLONOSCOPY WITH PROPOFOL;  Surgeon: Regis Bill, MD;  Location: [facility] ENDOSCOPY;  Service: Endoscopy;  Laterality: N/A;   TONSILLECTOMY      Social History   Tobacco Use   Smoking status: Never   Smokeless tobacco: Never  Vaping Use   Vaping Use: Never used  Substance Use Topics   Alcohol use: No   Drug use: No     Medication list has been reviewed and updated.  Current Meds  Medication Sig   azithromycin (ZITHROMAX Z-PAK) 250 MG tablet UAD   benzonatate (TESSALON) 100 MG capsule Take 2 capsules (200 mg total) by mouth every 8 (eight) hours.   cefdinir (OMNICEF) 300 MG capsule Take 1 capsule (300 mg total) by mouth 2 (two) times daily.   glucose blood test strip 1 each by Other route as needed for  other. Use as instructed   ipratropium (ATROVENT) 0.06 % nasal spray Place 2 sprays into both nostrils 4 (four) times daily.   ketorolac (TORADOL) 10 MG tablet Take 1 tablet (10 mg total) by mouth every 6 (six) hours as needed.   lisinopril (ZESTRIL) 10 MG tablet Take 1 tablet (10 mg total) by mouth daily.   metFORMIN (GLUCOPHAGE) 1000 MG tablet TAKE 1 TABLET BY MOUTH TWICE DAILY WITH MEALS   methocarbamol (ROBAXIN) 500 MG tablet Take 1 tablet (500 mg total) by mouth at bedtime. (Patient taking differently: Take 500 mg by mouth as needed.)   oxyCODONE-acetaminophen (PERCOCET) 5-325 MG tablet Take 1 tablet by mouth every 4 (four) hours as  needed for severe pain.   predniSONE (DELTASONE) 10 MG tablet Take 6 tablets (60 mg total) by mouth daily with breakfast for 1 day, THEN 5 tablets (50 mg total) daily with breakfast for 1 day, THEN 4 tablets (40 mg total) daily with breakfast for 1 day, THEN 3 tablets (30 mg total) daily with breakfast for 1 day, THEN 2 tablets (20 mg total) daily with breakfast for 1 day, THEN 1 tablet (10 mg total) daily with breakfast for 1 day.   promethazine-dextromethorphan (PROMETHAZINE-DM) 6.25-15 MG/5ML syrup Take 5 mLs by mouth 4 (four) times daily as needed.   rosuvastatin (CRESTOR) 10 MG tablet Take 1 tablet by mouth once daily   Semaglutide (RYBELSUS) 3 MG TABS Take 1 tablet by mouth once daily   tamsulosin (FLOMAX) 0.4 MG CAPS capsule Take 1 capsule (0.4 mg total) by mouth daily.   [DISCONTINUED] VITAMIN D PO Take by mouth daily. Gummies       04/17/2023    9:05 AM 10/17/2022    2:43 PM 09/11/2022   10:50 AM 01/10/2022    9:16 AM  GAD 7 : Generalized Anxiety Score  Nervous, Anxious, on Edge 0 0 0 0  Control/stop worrying 0 0 0 0  Worry too much - different things 0 0 0 0  Trouble relaxing 0 0 0 0  Restless 0 0 0 0  Easily annoyed or irritable 0 0 0 0  Afraid - awful might happen 0 0 0 0  Total GAD 7 Score 0 0 0 0  Anxiety Difficulty Not difficult at all Not difficult at all Not difficult at all Not difficult at all       04/17/2023    9:05 AM 10/17/2022    2:43 PM 09/11/2022   10:50 AM  Depression screen PHQ 2/9  Decreased Interest 0 0 0  Down, Depressed, Hopeless 0 0 0  PHQ - 2 Score 0 0 0  Altered sleeping 0 0 0  Tired, decreased energy 3 0 0  Change in appetite 3 0 0  Feeling bad or failure about yourself  0 0 0  Trouble concentrating 0 0 0  Moving slowly or fidgety/restless 0 0 0  Suicidal thoughts 0 0 0  PHQ-9 Score 6 0 0  Difficult doing work/chores Not difficult at all Not difficult at all Not difficult at all    BP Readings from Last 3 Encounters:  04/17/23 118/78   04/10/23 137/71  04/10/23 (!) 147/73    Physical Exam Vitals and nursing note reviewed.  Constitutional:      General: She is not in acute distress.    Appearance: She is well-developed.  HENT:     Head: Normocephalic and atraumatic.     Right Ear: Ear canal and external ear normal. Tympanic membrane is  not erythematous or retracted.     Left Ear: Ear canal and external ear normal. Tympanic membrane is not erythematous or retracted.     Nose:     Right Sinus: Maxillary sinus tenderness present. No frontal sinus tenderness.     Left Sinus: Maxillary sinus tenderness present. No frontal sinus tenderness.     Mouth/Throat:     Pharynx: Uvula midline.  Cardiovascular:     Rate and Rhythm: Normal rate and regular rhythm.     Heart sounds: Normal heart sounds.  Pulmonary:     Effort: Pulmonary effort is normal. No respiratory distress.     Breath sounds: Normal breath sounds. No wheezing or rales.  Lymphadenopathy:     Cervical: No cervical adenopathy.  Skin:    General: Skin is warm and dry.     Findings: No rash.  Neurological:     Mental Status: She is alert and oriented to person, place, and time.  Psychiatric:        Mood and Affect: Mood normal.        Behavior: Behavior normal.     Wt Readings from Last 3 Encounters:  04/17/23 124 lb (56.2 kg)  04/10/23 134 lb 7.7 oz (61 kg)  04/09/23 135 lb (61.2 kg)    BP 118/78   Pulse 100   Temp 98.7 F (37.1 C) (Oral)   Ht 5\' 1"  (1.549 m)   Wt 124 lb (56.2 kg)   LMP  (LMP Unknown)   SpO2 100%   BMI 23.43 kg/m   Assessment and Plan:  Problem List Items Addressed This Visit   None Visit Diagnoses     Acute non-recurrent maxillary sinusitis    -  Primary   Relevant Medications   predniSONE (DELTASONE) 10 MG tablet   azithromycin (ZITHROMAX Z-PAK) 250 MG tablet       No follow-ups on file.   Partially dictated using Dragon software, any errors are not intentional.  Reubin Milan, MD [facility] [place], [place]

## 2023-04-18 ENCOUNTER — Encounter: Payer: Self-pay | Admitting: Internal Medicine

## 2023-04-18 NOTE — Telephone Encounter (Signed)
Please review.  KP

## 2023-04-20 ENCOUNTER — Ambulatory Visit
Admission: RE | Admit: 2023-04-20 | Discharge: 2023-04-20 | Disposition: A | Discharge status: Home / Self Care | Payer: BC Managed Care – PPO | Source: Ambulatory Visit | Attending: Physician Assistant | Admitting: Physician Assistant

## 2023-04-20 ENCOUNTER — Ambulatory Visit: Payer: BC Managed Care – PPO | Admitting: Physician Assistant

## 2023-04-20 ENCOUNTER — Telehealth: Payer: Self-pay | Admitting: Physician Assistant

## 2023-04-20 ENCOUNTER — Other Ambulatory Visit: Payer: Self-pay | Admitting: Physician Assistant

## 2023-04-20 ENCOUNTER — Telehealth: Payer: Self-pay

## 2023-04-20 VITALS — BP 147/85 | HR 75 | Ht 61.0 in | Wt 124.0 lb

## 2023-04-20 DIAGNOSIS — N200 Calculus of kidney: Secondary | ICD-10-CM

## 2023-04-20 DIAGNOSIS — H903 Sensorineural hearing loss, bilateral: Secondary | ICD-10-CM | POA: Diagnosis not present

## 2023-04-20 DIAGNOSIS — R109 Unspecified abdominal pain: Secondary | ICD-10-CM | POA: Diagnosis not present

## 2023-04-20 DIAGNOSIS — N201 Calculus of ureter: Secondary | ICD-10-CM | POA: Diagnosis not present

## 2023-04-20 DIAGNOSIS — H6123 Impacted cerumen, bilateral: Secondary | ICD-10-CM | POA: Diagnosis not present

## 2023-04-20 LAB — URINALYSIS, COMPLETE
Bilirubin, UA: NEGATIVE
Glucose, UA: NEGATIVE
Nitrite, UA: NEGATIVE
Protein,UA: NEGATIVE
RBC, UA: NEGATIVE
Specific Gravity, UA: 1.025 (ref 1.005–1.030)
Urobilinogen, Ur: 0.2 mg/dL (ref 0.2–1.0)
pH, UA: 5.5 (ref 5.0–7.5)

## 2023-04-20 LAB — MICROSCOPIC EXAMINATION

## 2023-04-20 MED ORDER — ONDANSETRON 4 MG PO TBDP
4.0000 mg | ORAL_TABLET | Freq: Three times a day (TID) | ORAL | 0 refills | Status: DC | PRN
Start: 2023-04-20 — End: 2023-04-30

## 2023-04-20 NOTE — H&P (View-Only) (Signed)
 04/20/2023 1:38 PM   Teresa Rivas 08/25/1966 [id number]  CC: Chief Complaint  Patient presents with   Follow-up   Nephrolithiasis    HPI: Teresa Rivas is a 56 y.o. female with PMH nephrolithiasis who presents today to discuss management of a 5 mm right ureteral stone.  She is accompanied today by her husband, who contributes to HPI.  She was seen in the ED 10 days ago with reports of 3 days of RLQ pain.  UA notable for 11-20 WBCs/hpf, >50 RBCs/hpf, and rare bacteria.  CTAP with contrast revealed an obstructing 5 mm right ureteral stone at the level of the iliac vessels with upstream hydroureteronephrosis.  She was given analgesia, Flomax, and prescribed Omnicef 3 mg twice daily x 7 days. No urine culture sent.    Today she reports feeling rather well. She had some severe urgency and frequency 2 days ago which has now resolved.  She has not seen a stone pass.  She remains on Omnicef and a Z-Pak for URI.  She denies fevers or dysuria.  They report that she has had intermittent severe right-sided abdominal pains for approximately the past 2 years of unknown origin.  She is scheduled to see GI in July for this.  They wonder if her ureteral stone could be the source of these longstanding symptoms.  KUB today with multiple phleboliths but no evidence of radiopaque ureteral stones.  In-office UA today positive for trace ketones and 2+ leukocytes; urine microscopy with 11-30 WBCs/HPF, granular and hyaline casts, and moderate bacteria.  PMH: Past Medical History:  Diagnosis Date   Diabetes mellitus without complication (HCC)    History of kidney stones    Hyperlipidemia    Hypertension    Kidney stones 04/27/2017    Surgical History: Past Surgical History:  Procedure Laterality Date   ABDOMINAL HYSTERECTOMY  07/28/2016   total due to fibroids   COLONOSCOPY WITH PROPOFOL N/A 01/21/2021   Procedure: COLONOSCOPY WITH PROPOFOL;  Surgeon: Locklear, Cameron T,  MD;  Location: [facility] ENDOSCOPY;  Service: Endoscopy;  Laterality: N/A;   TONSILLECTOMY      Home Medications:  Allergies as of 04/20/2023       Reactions   Codeine         Medication List        Accurate as of Apr 20, 2023  1:38 PM. If you have any questions, ask your nurse or doctor.          azithromycin 250 MG tablet Commonly known as: Zithromax Z-Pak UAD   benzonatate 100 MG capsule Commonly known as: TESSALON Take 2 capsules (200 mg total) by mouth every 8 (eight) hours.   cefdinir 300 MG capsule Commonly known as: OMNICEF Take 1 capsule (300 mg total) by mouth 2 (two) times daily.   glucose blood test strip 1 each by Other route as needed for other. Use as instructed   ipratropium 0.06 % nasal spray Commonly known as: ATROVENT Place 2 sprays into both nostrils 4 (four) times daily.   ketorolac 10 MG tablet Commonly known as: TORADOL Take 1 tablet (10 mg total) by mouth every 6 (six) hours as needed.   lisinopril 10 MG tablet Commonly known as: ZESTRIL Take 1 tablet (10 mg total) by mouth daily.   metFORMIN 1000 MG tablet Commonly known as: GLUCOPHAGE TAKE 1 TABLET BY MOUTH TWICE DAILY WITH MEALS   methocarbamol 500 MG tablet Commonly known as: Robaxin Take 1 tablet (500 mg total) by mouth at   bedtime. What changed:  when to take this reasons to take this   oxyCODONE-acetaminophen 5-325 MG tablet Commonly known as: Percocet Take 1 tablet by mouth every 4 (four) hours as needed for severe pain.   predniSONE 10 MG tablet Commonly known as: DELTASONE Take 6 tablets (60 mg total) by mouth daily with breakfast for 1 day, THEN 5 tablets (50 mg total) daily with breakfast for 1 day, THEN 4 tablets (40 mg total) daily with breakfast for 1 day, THEN 3 tablets (30 mg total) daily with breakfast for 1 day, THEN 2 tablets (20 mg total) daily with breakfast for 1 day, THEN 1 tablet (10 mg total) daily with breakfast for 1 day. Start taking on: Apr 17, 2023    promethazine-dextromethorphan 6.25-15 MG/5ML syrup Commonly known as: PROMETHAZINE-DM Take 5 mLs by mouth 4 (four) times daily as needed.   rosuvastatin 10 MG tablet Commonly known as: CRESTOR Take 1 tablet by mouth once daily   Rybelsus 3 MG Tabs Generic drug: Semaglutide Take 1 tablet by mouth once daily   tamsulosin 0.4 MG Caps capsule Commonly known as: FLOMAX Take 1 capsule (0.4 mg total) by mouth daily.        Allergies:  Allergies  Allergen Reactions   Codeine     Family History: Family History  Problem Relation Age of Onset   Diabetes Mother    Hypertension Mother    Breast cancer Mother 70   Breast cancer Maternal Grandmother    Breast cancer Cousin        pat cousin    Social History:   reports that she has never smoked. She has never used smokeless tobacco. She reports that she does not drink alcohol and does not use drugs.  Physical Exam: BP (!) 147/85   Pulse 75   Ht 5' 1" (1.549 m)   Wt 124 lb (56.2 kg)   LMP  (LMP Unknown)   BMI 23.43 kg/m   Constitutional:  Alert and oriented, no acute distress, nontoxic appearing HEENT: [place], AT Cardiovascular: No clubbing, cyanosis, or edema Respiratory: Normal respiratory effort, no increased work of breathing Skin: No rashes, bruises or suspicious lesions Neurologic: Grossly intact, no focal deficits, moving all 4 extremities Psychiatric: Normal mood and affect  Laboratory Data: Results for orders placed or performed in visit on 04/20/23  Microscopic Examination   Urine  Result Value Ref Range   WBC, UA 11-30 (A) 0 - 5 /hpf   RBC, Urine 0-2 0 - 2 /hpf   Epithelial Cells (non renal) 0-10 0 - 10 /hpf   Casts Present (A) None seen /lpf   Cast Type Hyaline casts N/A   Mucus, UA Present (A) Not Estab.   Bacteria, UA Moderate (A) None seen/Few  Urinalysis, Complete  Result Value Ref Range   Specific Gravity, UA 1.025 1.005 - 1.030   pH, UA 5.5 5.0 - 7.5   Color, UA Yellow Yellow   Appearance Ur  Clear Clear   Leukocytes,UA 2+ (A) Negative   Protein,UA Negative Negative/Trace   Glucose, UA Negative Negative   Ketones, UA Trace (A) Negative   RBC, UA Negative Negative   Bilirubin, UA Negative Negative   Urobilinogen, Ur 0.2 0.2 - 1.0 mg/dL   Nitrite, UA Negative Negative   Microscopic Examination See below:    Pertinent Imaging: KUB, 04/20/2023: See Epic  I personally reviewed the images referenced above and note no radiopaque ureteral stones.  Assessment & Plan:   1. Right ureteral stone   5 mm mid right ureteral stone diagnosed in the ED 10 days ago, however she reports up to 2 years of intermittent severe right-sided abdominal pain which may indicate chronicity of this ureteral stone.  Stone is not visible on x-ray today, however I think it is unlikely that she has passed it without noticing and more strongly suspect it is radiolucent given relatively low density on CT.  She is comfortable today and well-appearing, denying infective symptoms.  I counseled her to continue the antibiotic she is already on and will repeat a culture today.  No indication for urgent intervention at this time.  We discussed various treatment options for her stone including trial of passage vs. ureteroscopy with laser lithotripsy and stent.  We discussed that if her stone has truly been in her ureter as long as they suspect, she is highly unlikely to spontaneously pass it.  We specifically discussed that ureteroscopy is an outpatient procedure performed under general anesthesia and requires placement of a ureteral stent, which will remain in place for approximately 3-10 days and can be associated with flank pain, bladder pain, dysuria, urgency, frequency, urinary leakage, and gross hematuria.  Based on this conversation, she would like to proceed with ureteroscopy.  She will need to stop oral semaglutide preop.  Prescribing Zofran for symptom management and she declined Percocet today because she already  has some at home.  We discussed return precautions including fever, uncontrolled pain, and uncontrolled nausea/vomiting.  I will speak with Melissa later this afternoon regarding scheduling and call her with the plan. - Urinalysis, Complete - CULTURE, URINE COMPREHENSIVE - ondansetron (ZOFRAN-ODT) 4 MG disintegrating tablet; Take 1 tablet (4 mg total) by mouth every 8 (eight) hours as needed for nausea or vomiting.  Dispense: 20 tablet; Refill: 0   Return for Will call to schedule surgery.  Codie Krogh, PA-C  [facility] [address], [address] [place], [place] [address] [phone]    

## 2023-04-20 NOTE — Progress Notes (Signed)
04/20/2023 1:38 PM   Teresa Rivas 1966/01/17 [id number]  CC: Chief Complaint  Patient presents with   Follow-up   Nephrolithiasis    HPI: Teresa Rivas is a 56 y.o. female with PMH nephrolithiasis who presents today to discuss management of a 5 mm right ureteral stone.  She is accompanied today by her husband, who contributes to HPI.  She was seen in the ED 10 days ago with reports of 3 days of RLQ pain.  UA notable for 11-20 WBCs/hpf, >50 RBCs/hpf, and rare bacteria.  CTAP with contrast revealed an obstructing 5 mm right ureteral stone at the level of the iliac vessels with upstream hydroureteronephrosis.  She was given analgesia, Flomax, and prescribed Omnicef 3 mg twice daily x 7 days. No urine culture sent.    Today she reports feeling rather well. She had some severe urgency and frequency 2 days ago which has now resolved.  She has not seen a stone pass.  She remains on Omnicef and a Z-Pak for URI.  She denies fevers or dysuria.  They report that she has had intermittent severe right-sided abdominal pains for approximately the past 2 years of unknown origin.  She is scheduled to see GI in July for this.  They wonder if her ureteral stone could be the source of these longstanding symptoms.  KUB today with multiple phleboliths but no evidence of radiopaque ureteral stones.  In-office UA today positive for trace ketones and 2+ leukocytes; urine microscopy with 11-30 WBCs/HPF, granular and hyaline casts, and moderate bacteria.  PMH: Past Medical History:  Diagnosis Date   Diabetes mellitus without complication (HCC)    History of kidney stones    Hyperlipidemia    Hypertension    Kidney stones 04/27/2017    Surgical History: Past Surgical History:  Procedure Laterality Date   ABDOMINAL HYSTERECTOMY  07/28/2016   total due to fibroids   COLONOSCOPY WITH PROPOFOL N/A 01/21/2021   Procedure: COLONOSCOPY WITH PROPOFOL;  Surgeon: Regis Bill,  MD;  Location: [facility] ENDOSCOPY;  Service: Endoscopy;  Laterality: N/A;   TONSILLECTOMY      Home Medications:  Allergies as of 04/20/2023       Reactions   Codeine         Medication List        Accurate as of Apr 20, 2023  1:38 PM. If you have any questions, ask your nurse or doctor.          azithromycin 250 MG tablet Commonly known as: Zithromax Z-Pak UAD   benzonatate 100 MG capsule Commonly known as: TESSALON Take 2 capsules (200 mg total) by mouth every 8 (eight) hours.   cefdinir 300 MG capsule Commonly known as: OMNICEF Take 1 capsule (300 mg total) by mouth 2 (two) times daily.   glucose blood test strip 1 each by Other route as needed for other. Use as instructed   ipratropium 0.06 % nasal spray Commonly known as: ATROVENT Place 2 sprays into both nostrils 4 (four) times daily.   ketorolac 10 MG tablet Commonly known as: TORADOL Take 1 tablet (10 mg total) by mouth every 6 (six) hours as needed.   lisinopril 10 MG tablet Commonly known as: ZESTRIL Take 1 tablet (10 mg total) by mouth daily.   metFORMIN 1000 MG tablet Commonly known as: GLUCOPHAGE TAKE 1 TABLET BY MOUTH TWICE DAILY WITH MEALS   methocarbamol 500 MG tablet Commonly known as: Robaxin Take 1 tablet (500 mg total) by mouth at  bedtime. What changed:  when to take this reasons to take this   oxyCODONE-acetaminophen 5-325 MG tablet Commonly known as: Percocet Take 1 tablet by mouth every 4 (four) hours as needed for severe pain.   predniSONE 10 MG tablet Commonly known as: DELTASONE Take 6 tablets (60 mg total) by mouth daily with breakfast for 1 day, THEN 5 tablets (50 mg total) daily with breakfast for 1 day, THEN 4 tablets (40 mg total) daily with breakfast for 1 day, THEN 3 tablets (30 mg total) daily with breakfast for 1 day, THEN 2 tablets (20 mg total) daily with breakfast for 1 day, THEN 1 tablet (10 mg total) daily with breakfast for 1 day. Start taking on: Apr 17, 2023    promethazine-dextromethorphan 6.25-15 MG/5ML syrup Commonly known as: PROMETHAZINE-DM Take 5 mLs by mouth 4 (four) times daily as needed.   rosuvastatin 10 MG tablet Commonly known as: CRESTOR Take 1 tablet by mouth once daily   Rybelsus 3 MG Tabs Generic drug: Semaglutide Take 1 tablet by mouth once daily   tamsulosin 0.4 MG Caps capsule Commonly known as: FLOMAX Take 1 capsule (0.4 mg total) by mouth daily.        Allergies:  Allergies  Allergen Reactions   Codeine     Family History: Family History  Problem Relation Age of Onset   Diabetes Mother    Hypertension Mother    Breast cancer Mother 26   Breast cancer Maternal Grandmother    Breast cancer Cousin        pat cousin    Social History:   reports that she has never smoked. She has never used smokeless tobacco. She reports that she does not drink alcohol and does not use drugs.  Physical Exam: BP (!) 147/85   Pulse 75   Ht 5\' 1"  (1.549 m)   Wt 124 lb (56.2 kg)   LMP  (LMP Unknown)   BMI 23.43 kg/m   Constitutional:  Alert and oriented, no acute distress, nontoxic appearing HEENT: [place], AT Cardiovascular: No clubbing, cyanosis, or edema Respiratory: Normal respiratory effort, no increased work of breathing Skin: No rashes, bruises or suspicious lesions Neurologic: Grossly intact, no focal deficits, moving all 4 extremities Psychiatric: Normal mood and affect  Laboratory Data: Results for orders placed or performed in visit on 04/20/23  Microscopic Examination   Urine  Result Value Ref Range   WBC, UA 11-30 (A) 0 - 5 /hpf   RBC, Urine 0-2 0 - 2 /hpf   Epithelial Cells (non renal) 0-10 0 - 10 /hpf   Casts Present (A) None seen /lpf   Cast Type Hyaline casts N/A   Mucus, UA Present (A) Not Estab.   Bacteria, UA Moderate (A) None seen/Few  Urinalysis, Complete  Result Value Ref Range   Specific Gravity, UA 1.025 1.005 - 1.030   pH, UA 5.5 5.0 - 7.5   Color, UA Yellow Yellow   Appearance Ur  Clear Clear   Leukocytes,UA 2+ (A) Negative   Protein,UA Negative Negative/Trace   Glucose, UA Negative Negative   Ketones, UA Trace (A) Negative   RBC, UA Negative Negative   Bilirubin, UA Negative Negative   Urobilinogen, Ur 0.2 0.2 - 1.0 mg/dL   Nitrite, UA Negative Negative   Microscopic Examination See below:    Pertinent Imaging: KUB, 04/20/2023: See Epic  I personally reviewed the images referenced above and note no radiopaque ureteral stones.  Assessment & Plan:   1. Right ureteral stone  5 mm mid right ureteral stone diagnosed in the ED 10 days ago, however she reports up to 2 years of intermittent severe right-sided abdominal pain which may indicate chronicity of this ureteral stone.  Stone is not visible on x-ray today, however I think it is unlikely that she has passed it without noticing and more strongly suspect it is radiolucent given relatively low density on CT.  She is comfortable today and well-appearing, denying infective symptoms.  I counseled her to continue the antibiotic she is already on and will repeat a culture today.  No indication for urgent intervention at this time.  We discussed various treatment options for her stone including trial of passage vs. ureteroscopy with laser lithotripsy and stent.  We discussed that if her stone has truly been in her ureter as long as they suspect, she is highly unlikely to spontaneously pass it.  We specifically discussed that ureteroscopy is an outpatient procedure performed under general anesthesia and requires placement of a ureteral stent, which will remain in place for approximately 3-10 days and can be associated with flank pain, bladder pain, dysuria, urgency, frequency, urinary leakage, and gross hematuria.  Based on this conversation, she would like to proceed with ureteroscopy.  She will need to stop oral semaglutide preop.  Prescribing Zofran for symptom management and she declined Percocet today because she already  has some at home.  We discussed return precautions including fever, uncontrolled pain, and uncontrolled nausea/vomiting.  I will speak with Melissa later this afternoon regarding scheduling and call her with the plan. - Urinalysis, Complete - CULTURE, URINE COMPREHENSIVE - ondansetron (ZOFRAN-ODT) 4 MG disintegrating tablet; Take 1 tablet (4 mg total) by mouth every 8 (eight) hours as needed for nausea or vomiting.  Dispense: 20 tablet; Refill: 0   Return for Will call to schedule surgery.  Carman Ching, PA-C  [facility] [place] [address], [address] [place], [place] [address] [phone]

## 2023-04-20 NOTE — Telephone Encounter (Signed)
I just spoke with the patient via telephone to discuss moving forward with a tentative surgery date for right ureteroscopy with laser lithotripsy and stent placement with Dr. Richardo Hanks next Friday, 04/27/2023.  I explained that Dr. Richardo Hanks is out of the office today, but will be back on Tuesday.  I will make him aware and reach out to her if he would like to make any changes to the plan.  She expressed understanding.

## 2023-04-20 NOTE — Progress Notes (Unsigned)
Surgical Physician Order Form [facility] [place]  Dr. Richardo Hanks * Scheduling expectation :  04/27/2023  *Length of Case:   *Clearance needed: no, needs to stop oral semaglutide preop  *Anticoagulation Instructions: N/A  *Aspirin Instructions: N/A  *Post-op visit Date/Instructions:   TBD  *Diagnosis: Right Ureteral Stone  *Procedure: right Ureteroscopy w/laser lithotripsy & stent placement ([id number])   Additional orders: N/A  -Admit type: OUTpatient  -Anesthesia: General  -VTE Prophylaxis Standing Order SCD's       Other:   -Standing Lab Orders Per Anesthesia    Lab other: None  -Standing Test orders EKG/Chest x-ray per Anesthesia       Test other:   - Medications:  Ancef 2gm IV  -Other orders:  N/A

## 2023-04-20 NOTE — Patient Instructions (Addendum)
We will call you to discuss scheduling a right ureteroscopy to treat your kidney stone. In the meantime, please do the following: -Continue Flomax (tamsulosin) 0.4mg  daily (this medication was already prescribed in the ED) -Treat any pain with ibuprofen/tylenol or Percocet as needed -Treat any nausea with Zofran as needed (I prescribed this medication today)  Please call our office immediately (we are open 8a-5p Monday-Friday but CLOSED on Memorial Day) or go to the Emergency Department if you develop any of the following: -Fever/chills -Nausea and/or vomiting uncontrollable with Zofran -Pain uncontrollable with Percocet

## 2023-04-20 NOTE — Telephone Encounter (Signed)
Spoke with pt. Pt. States that she has a diabetes appointment on Friday 05/31 but is going to try and change it and give [place] call on Tuesday to schedule.

## 2023-04-23 LAB — CULTURE, URINE COMPREHENSIVE

## 2023-04-24 LAB — CULTURE, URINE COMPREHENSIVE

## 2023-04-24 NOTE — Progress Notes (Signed)
   [facility] Surgical Posting Form  Surgery Date: Date: 04/27/2023  Surgeon: Dr. Legrand Rams, MD  Inpt ( No  )   Outpt (Yes)   Obs ( No  )   Diagnosis: N20.1 Right Ureteral Stone  -CPT: [phone]  Surgery: Right Ureteroscopy with Laser Lithotripsy and Stent Placement  Stop Anticoagulations: No  Cardiac/Medical/Pulmonary Clearance needed: no  *Orders entered into EPIC  Date: 04/24/23   *Case booked in [place]  Date: 04/24/23  *Notified pt of Surgery: Date: 04/24/23  PRE-OP UA & CX: no  *Placed into Prior Authorization Work [place] Date: 04/24/23  Assistant/laser/rep:No

## 2023-04-24 NOTE — Telephone Encounter (Signed)
I spoke with Mrs. Teresa Rivas. We have discussed possible surgery dates and Friday May 31st, 2024 was agreed upon by all parties. Patient given information about surgery date, what to expect pre-operatively and post operatively.  We discussed that a Pre-Admission Testing office will be calling to set up the pre-op visit that will take place prior to surgery, and that these appointments are typically done over the phone with a Pre-Admissions RN. Informed patient that our office will communicate any additional care to be provided after surgery. Patients questions or concerns were discussed during our call. Advised to call our office should there be any additional information, questions or concerns that arise. Patient verbalized understanding.

## 2023-04-26 ENCOUNTER — Other Ambulatory Visit: Payer: Self-pay

## 2023-04-26 ENCOUNTER — Encounter
Admission: RE | Admit: 2023-04-26 | Discharge: 2023-04-26 | Disposition: A | Discharge status: Home / Self Care | Payer: BC Managed Care – PPO | Source: Ambulatory Visit | Attending: Urology | Admitting: Urology

## 2023-04-26 DIAGNOSIS — I10 Essential (primary) hypertension: Secondary | ICD-10-CM

## 2023-04-26 DIAGNOSIS — E118 Type 2 diabetes mellitus with unspecified complications: Secondary | ICD-10-CM

## 2023-04-26 HISTORY — DX: Anemia, unspecified: D64.9

## 2023-04-26 HISTORY — DX: Headache, unspecified: R51.9

## 2023-04-26 NOTE — Patient Instructions (Addendum)
Your procedure is scheduled on: 04/27/23 - Friday Report to the Registration Desk on the 1st floor of the [facility]. To find out your arrival time, please call [phone] between 1PM - 3PM on: 04/26/23 - Thursday If your arrival time is 6:00 am, do not arrive before that time as the [facility] entrance doors do not open until 6:00 am.  REMEMBER: Instructions that are not followed completely may result in serious medical risk, up to and including death; or upon the discretion of your surgeon and anesthesiologist your surgery may need to be rescheduled.  Do not eat food or drink any liquids after midnight the night before surgery.  No gum chewing or hard candies.  One week prior to surgery: Stop Anti-inflammatories (NSAIDS) such as Advil, Aleve, Ibuprofen, Motrin, Naproxen, Naprosyn and Aspirin based products such as Excedrin, Goody's Powder, BC Powder.  Stop ANY OVER THE COUNTER supplements until after surgery.  You may however, continue to take Tylenol if needed for pain up until the day of surgery.  Continue taking all prescribed medications with the exception of the following:  1, metFORMIN (GLUCOPHAGE) stop taking 04/26/23 . 2, Semaglutide (RYBELSUS) stop taking 04/26/23. 3. ketorolac (TORADOL) stop taking 04/26/23.  TAKE ONLY THESE MEDICATIONS THE MORNING OF SURGERY WITH A SIP OF WATER:  Tamsulosin (FLOMAX)  rosuvastatin (CRESTOR)    No Alcohol for 24 hours before or after surgery.  No Smoking including e-cigarettes for 24 hours before surgery.  No chewable tobacco products for at least 6 hours before surgery.  No nicotine patches on the day of surgery.  Do not use any "recreational" drugs for at least a week (preferably 2 weeks) before your surgery.  Please be advised that the combination of cocaine and anesthesia may have negative outcomes, up to and including death. If you test positive for cocaine, your surgery will be cancelled.  On the morning of surgery  brush your teeth with toothpaste and water, you may rinse your mouth with mouthwash if you wish. Do not swallow any toothpaste or mouthwash.  Do not wear jewelry, make-up, hairpins, clips or nail polish.  Do not wear lotions, powders, or perfumes.   Do not shave body hair from the neck down 48 hours before surgery.  Contact lenses, hearing aids and dentures may not be worn into surgery.  Do not bring valuables to the hospital. [facility] is not responsible for any missing/lost belongings or valuables.   Notify your doctor if there is any change in your medical condition (cold, fever, infection).  Wear comfortable clothing (specific to your surgery type) to the hospital.  After surgery, you can help prevent lung complications by doing breathing exercises.  Take deep breaths and cough every 1-2 hours. Your doctor may order a device called an Incentive Spirometer to help you take deep breaths. When coughing or sneezing, hold a pillow firmly against your incision with both hands. This is called "splinting." Doing this helps protect your incision. It also decreases belly discomfort.  If you are being admitted to the hospital overnight, leave your suitcase in the car. After surgery it may be brought to your room.  In case of increased patient census, it may be necessary for you, the patient, to continue your postoperative care in the Same Day Surgery department.  If you are being discharged the day of surgery, you will not be allowed to drive home. You will need a responsible individual to drive you home and stay with you for 24 hours  after surgery.   If you are taking public transportation, you will need to have a responsible individual with you.  Please call the Pre-admissions Testing Dept. at [phone] if you have any questions about these instructions.  Surgery Visitation Policy:  Patients having surgery or a procedure may have two visitors.  Children under the age of 25 must  have an adult with them who is not the patient.  Inpatient Visitation:    Visiting hours are 7 a.m. to 8 p.m. Up to four visitors are allowed at one time in a patient room. The visitors may rotate out with other people during the day.  One visitor age 28 or older may stay with the patient overnight and must be in the room by 8 p.m.

## 2023-04-27 ENCOUNTER — Encounter: Payer: Self-pay | Admitting: Urology

## 2023-04-27 ENCOUNTER — Ambulatory Visit: Payer: BC Managed Care – PPO | Admitting: Internal Medicine

## 2023-04-27 ENCOUNTER — Ambulatory Visit: Payer: BC Managed Care – PPO | Admitting: Anesthesiology

## 2023-04-27 ENCOUNTER — Encounter
Admission: RE | Disposition: A | Discharge status: Home / Self Care | Payer: Self-pay | Source: Home / Self Care | Attending: Urology

## 2023-04-27 ENCOUNTER — Ambulatory Visit
Admission: RE | Admit: 2023-04-27 | Discharge: 2023-04-27 | Disposition: A | Discharge status: Home / Self Care | Payer: BC Managed Care – PPO | Attending: Urology | Admitting: Urology

## 2023-04-27 ENCOUNTER — Other Ambulatory Visit: Payer: Self-pay

## 2023-04-27 ENCOUNTER — Ambulatory Visit: Payer: BC Managed Care – PPO

## 2023-04-27 DIAGNOSIS — Z87442 Personal history of urinary calculi: Secondary | ICD-10-CM | POA: Diagnosis not present

## 2023-04-27 DIAGNOSIS — E119 Type 2 diabetes mellitus without complications: Secondary | ICD-10-CM | POA: Insufficient documentation

## 2023-04-27 DIAGNOSIS — N200 Calculus of kidney: Secondary | ICD-10-CM | POA: Insufficient documentation

## 2023-04-27 DIAGNOSIS — E785 Hyperlipidemia, unspecified: Secondary | ICD-10-CM | POA: Insufficient documentation

## 2023-04-27 DIAGNOSIS — R519 Headache, unspecified: Secondary | ICD-10-CM | POA: Insufficient documentation

## 2023-04-27 DIAGNOSIS — I10 Essential (primary) hypertension: Secondary | ICD-10-CM | POA: Diagnosis not present

## 2023-04-27 DIAGNOSIS — N201 Calculus of ureter: Secondary | ICD-10-CM | POA: Diagnosis not present

## 2023-04-27 DIAGNOSIS — D649 Anemia, unspecified: Secondary | ICD-10-CM | POA: Diagnosis not present

## 2023-04-27 DIAGNOSIS — Z01810 Encounter for preprocedural cardiovascular examination: Secondary | ICD-10-CM | POA: Diagnosis not present

## 2023-04-27 DIAGNOSIS — E118 Type 2 diabetes mellitus with unspecified complications: Secondary | ICD-10-CM

## 2023-04-27 HISTORY — PX: CYSTOSCOPY/URETEROSCOPY/HOLMIUM LASER/STENT PLACEMENT: SHX6546

## 2023-04-27 LAB — GLUCOSE, CAPILLARY
Glucose-Capillary: 105 mg/dL — ABNORMAL HIGH (ref 70–99)
Glucose-Capillary: 121 mg/dL — ABNORMAL HIGH (ref 70–99)

## 2023-04-27 SURGERY — CYSTOSCOPY/URETEROSCOPY/HOLMIUM LASER/STENT PLACEMENT
Anesthesia: General | Laterality: Right

## 2023-04-27 MED ORDER — DROPERIDOL 2.5 MG/ML IJ SOLN
0.6250 mg | INTRAMUSCULAR | Status: AC
  Administered 2023-04-27: 0.625 mg via INTRAVENOUS

## 2023-04-27 MED ORDER — ONDANSETRON HCL 4 MG/2ML IJ SOLN
INTRAMUSCULAR | Status: DC | PRN
  Administered 2023-04-27: 4 mg via INTRAVENOUS

## 2023-04-27 MED ORDER — ROCURONIUM BROMIDE 100 MG/10ML IV SOLN
INTRAVENOUS | Status: DC | PRN
  Administered 2023-04-27: 40 mg via INTRAVENOUS

## 2023-04-27 MED ORDER — CHLORHEXIDINE GLUCONATE 0.12 % MT SOLN
OROMUCOSAL | Status: AC
  Filled 2023-04-27: qty 15

## 2023-04-27 MED ORDER — ALBUTEROL SULFATE HFA 108 (90 BASE) MCG/ACT IN AERS
INHALATION_SPRAY | RESPIRATORY_TRACT | Status: DC | PRN
  Administered 2023-04-27: 6 via RESPIRATORY_TRACT

## 2023-04-27 MED ORDER — ACETAMINOPHEN 160 MG/5ML PO SUSP
ORAL | Status: AC
  Filled 2023-04-27: qty 25

## 2023-04-27 MED ORDER — PROMETHAZINE HCL 25 MG/ML IJ SOLN
6.2500 mg | INTRAMUSCULAR | Status: AC
  Administered 2023-04-27: 6.25 mg via INTRAVENOUS

## 2023-04-27 MED ORDER — SUCCINYLCHOLINE CHLORIDE 200 MG/10ML IV SOSY
PREFILLED_SYRINGE | INTRAVENOUS | Status: DC | PRN
  Administered 2023-04-27: 50 mg via INTRAVENOUS

## 2023-04-27 MED ORDER — FENTANYL CITRATE (PF) 100 MCG/2ML IJ SOLN
INTRAMUSCULAR | Status: AC
  Filled 2023-04-27: qty 2

## 2023-04-27 MED ORDER — IOHEXOL 180 MG/ML  SOLN
INTRAMUSCULAR | Status: DC | PRN
  Administered 2023-04-27: 10 mL

## 2023-04-27 MED ORDER — LIDOCAINE HCL (CARDIAC) PF 100 MG/5ML IV SOSY
PREFILLED_SYRINGE | INTRAVENOUS | Status: DC | PRN
  Administered 2023-04-27: 50 mg via INTRAVENOUS

## 2023-04-27 MED ORDER — OXYCODONE HCL 5 MG/5ML PO SOLN: 5.0000 mg | Freq: Once | ORAL | Status: DC | PRN

## 2023-04-27 MED ORDER — CEFAZOLIN SODIUM-DEXTROSE 2-4 GM/100ML-% IV SOLN
2.0000 g | INTRAVENOUS | Status: AC
  Administered 2023-04-27: 2 g via INTRAVENOUS

## 2023-04-27 MED ORDER — CHLORHEXIDINE GLUCONATE 0.12 % MT SOLN
15.0000 mL | Freq: Once | OROMUCOSAL | Status: AC
  Administered 2023-04-27: 15 mL via OROMUCOSAL

## 2023-04-27 MED ORDER — DROPERIDOL 2.5 MG/ML IJ SOLN
INTRAMUSCULAR | Status: AC
  Filled 2023-04-27: qty 2

## 2023-04-27 MED ORDER — SODIUM CHLORIDE 0.9 % IV SOLN: INTRAVENOUS | Status: DC

## 2023-04-27 MED ORDER — CEPHALEXIN 500 MG PO CAPS: 500.0000 mg | ORAL_CAPSULE | Freq: Once | ORAL | 0 refills | Status: DC | PRN

## 2023-04-27 MED ORDER — SUGAMMADEX SODIUM 200 MG/2ML IV SOLN
INTRAVENOUS | Status: DC | PRN
  Administered 2023-04-27: 200 mg via INTRAVENOUS

## 2023-04-27 MED ORDER — FAMOTIDINE 20 MG PO TABS
20.0000 mg | ORAL_TABLET | Freq: Once | ORAL | Status: AC
  Administered 2023-04-27: 20 mg via ORAL

## 2023-04-27 MED ORDER — ORAL CARE MOUTH RINSE: 15.0000 mL | Freq: Once | OROMUCOSAL | Status: AC

## 2023-04-27 MED ORDER — ACETAMINOPHEN 160 MG/5ML PO SOLN
650.0000 mg | Freq: Once | ORAL | Status: DC
  Filled 2023-04-27: qty 20.3

## 2023-04-27 MED ORDER — DEXMEDETOMIDINE HCL IN NACL 80 MCG/20ML IV SOLN
INTRAVENOUS | Status: DC | PRN
  Administered 2023-04-27: 8 ug via INTRAVENOUS
  Administered 2023-04-27: 4 ug via INTRAVENOUS

## 2023-04-27 MED ORDER — FAMOTIDINE 20 MG PO TABS
ORAL_TABLET | ORAL | Status: AC
  Filled 2023-04-27: qty 1

## 2023-04-27 MED ORDER — OXYCODONE HCL 5 MG PO TABS: 5.0000 mg | ORAL_TABLET | Freq: Once | ORAL | Status: DC | PRN

## 2023-04-27 MED ORDER — PROMETHAZINE HCL 25 MG/ML IJ SOLN
INTRAMUSCULAR | Status: AC
  Filled 2023-04-27: qty 1

## 2023-04-27 MED ORDER — FENTANYL CITRATE (PF) 100 MCG/2ML IJ SOLN: 25.0000 ug | INTRAMUSCULAR | Status: DC | PRN

## 2023-04-27 MED ORDER — FENTANYL CITRATE (PF) 100 MCG/2ML IJ SOLN
INTRAMUSCULAR | Status: DC | PRN
  Administered 2023-04-27 (×2): 50 ug via INTRAVENOUS

## 2023-04-27 MED ORDER — KETOROLAC TROMETHAMINE 15 MG/ML IJ SOLN
INTRAMUSCULAR | Status: DC | PRN
  Administered 2023-04-27: 15 mg via INTRAVENOUS

## 2023-04-27 MED ORDER — CEFAZOLIN SODIUM-DEXTROSE 2-4 GM/100ML-% IV SOLN
INTRAVENOUS | Status: AC
  Filled 2023-04-27: qty 100

## 2023-04-27 MED ORDER — DEXAMETHASONE SODIUM PHOSPHATE 10 MG/ML IJ SOLN
INTRAMUSCULAR | Status: DC | PRN
  Administered 2023-04-27: 10 mg via INTRAVENOUS

## 2023-04-27 MED ORDER — SODIUM CHLORIDE 0.9 % IR SOLN
Status: DC | PRN
  Administered 2023-04-27: 3000 mL

## 2023-04-27 MED ORDER — PROPOFOL 10 MG/ML IV BOLUS
INTRAVENOUS | Status: DC | PRN
  Administered 2023-04-27: 100 mg via INTRAVENOUS

## 2023-04-27 MED ORDER — PHENYLEPHRINE HCL (PRESSORS) 10 MG/ML IV SOLN
INTRAVENOUS | Status: DC | PRN
  Administered 2023-04-27: 80 ug via INTRAVENOUS
  Administered 2023-04-27: 160 ug via INTRAVENOUS
  Administered 2023-04-27: 80 ug via INTRAVENOUS

## 2023-04-27 SURGICAL SUPPLY — 31 items
ADH LQ OCL WTPRF AMP STRL LF (MISCELLANEOUS)
ADHESIVE MASTISOL STRL (MISCELLANEOUS) IMPLANT
BAG DRAIN SIEMENS DORNER NS (MISCELLANEOUS) ×1 IMPLANT
BAG DRN NS LF (MISCELLANEOUS) ×1
BAG PRESSURE INF REUSE 3000 (BAG) ×1 IMPLANT
BRUSH SCRUB EZ 1% IODOPHOR (MISCELLANEOUS) ×1 IMPLANT
CATH URET FLEX-TIP 2 LUMEN 10F (CATHETERS) IMPLANT
CATH URETL OPEN 5X70 (CATHETERS) IMPLANT
CNTNR URN SCR LID CUP LEK RST (MISCELLANEOUS) IMPLANT
CONT SPEC 4OZ STRL OR WHT (MISCELLANEOUS)
DRAPE UTILITY 15X26 TOWEL STRL (DRAPES) ×1 IMPLANT
DRSG TEGADERM 2-3/8X2-3/4 SM (GAUZE/BANDAGES/DRESSINGS) IMPLANT
FIBER LASER MOSES 200 DFL (Laser) IMPLANT
GLOVE BIOGEL PI IND STRL 7.5 (GLOVE) ×1 IMPLANT
GOWN STRL REUS W/ TWL LRG LVL3 (GOWN DISPOSABLE) ×1 IMPLANT
GOWN STRL REUS W/ TWL XL LVL3 (GOWN DISPOSABLE) ×1 IMPLANT
GOWN STRL REUS W/TWL LRG LVL3 (GOWN DISPOSABLE) ×1
GOWN STRL REUS W/TWL XL LVL3 (GOWN DISPOSABLE) ×1
GUIDEWIRE STR DUAL SENSOR (WIRE) ×1 IMPLANT
IV NS IRRIG 3000ML ARTHROMATIC (IV SOLUTION) ×1 IMPLANT
KIT TURNOVER CYSTO (KITS) ×1 IMPLANT
PACK CYSTO AR (MISCELLANEOUS) ×1 IMPLANT
SET CYSTO W/LG BORE CLAMP LF (SET/KITS/TRAYS/PACK) ×1 IMPLANT
SHEATH NAVIGATOR HD 12/14X36 (SHEATH) IMPLANT
STENT URET 6FRX22 CONTOUR (STENTS) IMPLANT
STENT URET 6FRX24 CONTOUR (STENTS) IMPLANT
STENT URET 6FRX26 CONTOUR (STENTS) IMPLANT
SURGILUBE 2OZ TUBE FLIPTOP (MISCELLANEOUS) ×1 IMPLANT
SYR 10ML LL (SYRINGE) ×1 IMPLANT
VALVE UROSEAL ADJ ENDO (VALVE) IMPLANT
WATER STERILE IRR 500ML POUR (IV SOLUTION) ×1 IMPLANT

## 2023-04-27 NOTE — Transfer of Care (Signed)
Immediate Anesthesia Transfer of Care Note  Patient: Teresa Rivas  Procedure(s) Performed: CYSTOSCOPY/URETEROSCOPY/HOLMIUM LASER/STENT PLACEMENT (Right)  Patient Location: PACU  Anesthesia Type:General  Level of Consciousness: awake, alert , and patient cooperative  Airway & Oxygen Therapy: Patient Spontanous Breathing and Patient connected to face mask oxygen  Post-op Assessment: Report given to RN and Post -op Vital signs reviewed and stable  Post vital signs: stable  Last Vitals:  Vitals Value Taken Time  BP 100/57 04/27/23 1519  Temp 36.1 C 04/27/23 1519  Pulse 86 04/27/23 1522  Resp 15 04/27/23 1522  SpO2 100 % 04/27/23 1522  Vitals shown include unvalidated device data.  Last Pain:  Vitals:   04/27/23 1257  TempSrc: Tympanic  PainSc: 0-No pain         Complications: No notable events documented.

## 2023-04-27 NOTE — Anesthesia Procedure Notes (Signed)
Procedure Name: Intubation Date/Time: 04/27/2023 2:42 PM  Performed by: Maryla Morrow., CRNAPre-anesthesia Checklist: Patient identified, Patient being monitored, Timeout performed, Emergency Drugs available and Suction available Patient Re-evaluated:Patient Re-evaluated prior to induction Oxygen Delivery Method: Circle system utilized Preoxygenation: Pre-oxygenation with 100% oxygen Induction Type: IV induction Ventilation: Mask ventilation without difficulty Laryngoscope Size: 3 and McGraph Grade View: Grade I Tube type: Oral Tube size: 6.0 mm Number of attempts: 1 Placement Confirmation: ETT inserted through vocal cords under direct vision, positive ETCO2 and breath sounds checked- equal and bilateral Secured at: 19 cm Tube secured with: Tape Dental Injury: Teeth and Oropharynx as per pre-operative assessment

## 2023-04-27 NOTE — Interval H&P Note (Signed)
UROLOGY H&P UPDATE  Agree with prior H&P dated 04/20/2023 by Carman Ching, PA.  57 year old female with right-sided flank pain and CT showing a 5 mm right mid ureteral stone.  Continues to have some intermittent flank pain, KUB today with no definitive stone, but stones overlying the pelvis and may be difficult to see.  We discussed other alternatives like CT or proceeding with ureteroscopy and she would like to proceed with ureteroscopy.  Cardiac: RRR Lungs: CTA bilaterally  Laterality: Right Procedure: Right ureteroscopy, laser lithotripsy, stent placement culture  Urine: 5/24 no growth  We specifically discussed the risks ureteroscopy including bleeding, infection/sepsis, stent related symptoms including flank pain/urgency/frequency/incontinence/dysuria, ureteral injury, inability to access stone, or need for staged or additional procedures.   Sondra Come, MD 04/27/2023

## 2023-04-27 NOTE — Op Note (Signed)
Date of procedure: 04/27/23  Preoperative diagnosis:  Right ureteral stone  Postoperative diagnosis:  Right renal stone  Procedure: Cystoscopy, right ureteroscopy, laser lithotripsy of right renal stone, right retrograde pyelogram with intraoperative interpretation, right ureteral stent placement  Surgeon: Legrand Rams, MD  Anesthesia: General  Complications: None  Intraoperative findings:  Normal cystoscopy, right ureteral stone had passed spontaneously, uncomplicated dusting of right 5 mm upper pole stone  EBL: Minimal  Specimens: None  Drains: Right 6 French by 22 cm ureteral stent with Dangler  Indication: Teresa Rivas is a 57 y.o. patient with 5 mm right ureteral stone and ongoing renal colic who opted for ureteroscopy.  After reviewing the management options for treatment, they elected to proceed with the above surgical procedure(s). We have discussed the potential benefits and risks of the procedure, side effects of the proposed treatment, the likelihood of the patient achieving the goals of the procedure, and any potential problems that might occur during the procedure or recuperation. Informed consent has been obtained.  Description of procedure:  The patient was taken to the operating room and general anesthesia was induced. SCDs were placed for DVT prophylaxis. The patient was placed in the dorsal lithotomy position, prepped and draped in the usual sterile fashion, and preoperative antibiotics(Ancef) were administered. A preoperative time-out was performed.   A 21 French rigid cystoscope was used to intubate the urethra and thorough cystoscopy was performed.  The bladder was grossly normal, and ureteral orifices orthotopic bilaterally.  The sensor wire advanced easily into the right ureteral orifice and passed easily up to the kidney under fluoroscopic vision.  A semirigid long ureteroscope was then advanced alongside the wire, and a normal-appearing ureter  was followed proximally up to the kidney.  The ureter was normal-appearing throughout and no stones were visualized.  A digital single-channel flexible ureteroscope was then advanced easily over the wire up into the kidney and thorough pyeloscopy revealed a 5 mm stone in the upper pole.  A 200 m laser fiber on settings of 0.5 J and 80 Hz was used to carefully dust the stone to fragments smaller than the laser fiber.  Thorough pyeloscopy revealed no other stones or fragments.  Retrograde pyelogram was performed from the proximal ureter and showed no extravasation or filling defects.  Careful pullback ureteroscopy showed no ureteral abnormalities, sensor wire was replaced through the scope, and a stent was placed fluoroscopically with a curl in the kidney as well as in the bladder.  The bladder was drained and this concluded the procedure.  The Dangler was secured to the right inner thigh using Mastisol and Tegaderm.  Disposition: Stable to PACU  Plan: Remove stent at home on Monday morning RTC with PA 1 year KUB for stone surveillance  Legrand Rams, MD

## 2023-04-27 NOTE — Discharge Instructions (Addendum)
AMBULATORY SURGERY  DISCHARGE INSTRUCTIONS   The drugs that you were given will stay in your system until tomorrow so for the next 24 hours you should not:  Drive an automobile Make any legal decisions Drink any alcoholic beverage   You may resume regular meals tomorrow.  Today it is better to start with liquids and gradually work up to solid foods.  You may eat anything you prefer, but it is better to start with liquids, then soup and crackers, and gradually work up to solid foods.   Please notify your doctor immediately if you have any unusual bleeding, trouble breathing, redness and pain at the surgery site, drainage, fever, or pain not relieved by medication.       Please contact your physician with any problems or Same Day Surgery at [phone], Monday through Friday 6 am to 4 pm, or [place] [facility] Main number at [phone].  

## 2023-04-27 NOTE — Anesthesia Preprocedure Evaluation (Signed)
Anesthesia Evaluation  Patient identified by MRN, date of birth, ID band Patient awake    Reviewed: Allergy & Precautions, NPO status , Patient's Chart, lab work & pertinent test results  Airway Mallampati: III  TM Distance: >3 FB Neck ROM: full    Dental  (+) Dental Advidsory Given, Chipped   Pulmonary neg pulmonary ROS, neg COPD   Pulmonary exam normal        Cardiovascular hypertension, (-) Past MI negative cardio ROS Normal cardiovascular exam(-) dysrhythmias      Neuro/Psych negative neurological ROS  negative psych ROS   GI/Hepatic negative GI ROS, Neg liver ROS,,,  Endo/Other  negative endocrine ROSdiabetes    Renal/GU Renal disease     Musculoskeletal   Abdominal   Peds  Hematology negative hematology ROS (+)   Anesthesia Other Findings Past Medical History: No date: Anemia No date: Diabetes mellitus without complication (HCC) No date: Headache No date: History of kidney stones No date: Hyperlipidemia No date: Hypertension 04/27/2017: Kidney stones  Past Surgical History: 07/28/2016: ABDOMINAL HYSTERECTOMY     Comment:  total due to fibroids 01/21/2021: COLONOSCOPY WITH PROPOFOL; N/A     Comment:  Procedure: COLONOSCOPY WITH PROPOFOL;  Surgeon:               Regis Bill, MD;  Location: [facility] END[facility];                Service: Endoscopy;  Laterality: N/A; No date: TONSILLECTOMY  BMI    Body Mass Index: 24.56 kg/m      Reproductive/Obstetrics negative OB ROS                             Anesthesia Physical Anesthesia Plan  ASA: 2  Anesthesia Plan: General ETT   Post-op Pain Management:    Induction: Intravenous and Rapid sequence  PONV Risk Score and Plan: 4 or greater and Ondansetron, Dexamethasone and Midazolam  Airway Management Planned: Oral ETT  Additional Equipment:   Intra-op Plan:   Post-operative Plan: Extubation in OR  Informed Consent:  I have reviewed the patients History and Physical, chart, labs and discussed the procedure including the risks, benefits and alternatives for the proposed anesthesia with the patient or authorized representative who has indicated his/her understanding and acceptance.     Dental Advisory Given  Plan Discussed with: Anesthesiologist, CRNA and Surgeon  Anesthesia Plan Comments: (Patient consented for risks of anesthesia including but not limited to:  - adverse reactions to medications - damage to eyes, teeth, lips or other oral mucosa - nerve damage due to positioning  - sore throat or hoarseness - Damage to heart, brain, nerves, lungs, other parts of body or loss of life  Patient voiced understanding.)        Anesthesia Quick Evaluation

## 2023-04-30 ENCOUNTER — Encounter: Payer: Self-pay | Admitting: Urology

## 2023-04-30 ENCOUNTER — Telehealth: Payer: Self-pay | Admitting: Urology

## 2023-04-30 ENCOUNTER — Ambulatory Visit: Payer: BC Managed Care – PPO | Admitting: Internal Medicine

## 2023-04-30 VITALS — BP 122/70 | HR 96 | Ht 61.0 in | Wt 131.0 lb

## 2023-04-30 DIAGNOSIS — E118 Type 2 diabetes mellitus with unspecified complications: Secondary | ICD-10-CM

## 2023-04-30 DIAGNOSIS — E782 Mixed hyperlipidemia: Secondary | ICD-10-CM

## 2023-04-30 DIAGNOSIS — N200 Calculus of kidney: Secondary | ICD-10-CM

## 2023-04-30 DIAGNOSIS — I10 Essential (primary) hypertension: Secondary | ICD-10-CM

## 2023-04-30 LAB — POCT GLYCOSYLATED HEMOGLOBIN (HGB A1C): Hemoglobin A1C: 6.3 % — AB (ref 4.0–5.6)

## 2023-04-30 MED ORDER — LISINOPRIL 10 MG PO TABS: 10.0000 mg | ORAL_TABLET | Freq: Every day | ORAL | 1 refills | Status: DC

## 2023-04-30 MED ORDER — RYBELSUS 3 MG PO TABS
1.0000 | ORAL_TABLET | Freq: Every day | ORAL | 5 refills | Status: DC
Start: 2023-04-30 — End: 2024-04-25

## 2023-04-30 MED ORDER — METFORMIN HCL 1000 MG PO TABS: 1000.0000 mg | ORAL_TABLET | Freq: Two times a day (BID) | ORAL | 1 refills | Status: DC

## 2023-04-30 MED ORDER — ROSUVASTATIN CALCIUM 10 MG PO TABS: 10.0000 mg | ORAL_TABLET | Freq: Every day | ORAL | 1 refills | Status: DC

## 2023-04-30 NOTE — Telephone Encounter (Signed)
Patient called and stated she had surgery on 04/27/23, and she requested that a letter be faxed to her employer stating when she can return to work and with what restrictions. Please fax  letter to [phone]   Attention: Elnita Maxwell / [employer]

## 2023-04-30 NOTE — Assessment & Plan Note (Addendum)
Blood sugars stable without hypoglycemic symptoms or events. Currently being treated with metformin and Rybelsus. Lab Results  Component Value Date   HGBA1C 6.9 09/11/2022  A1C today is 6.3.  continue same regimen.

## 2023-04-30 NOTE — Progress Notes (Signed)
Date:  04/30/2023   Name:  Teresa Rivas   DOB:  1965/12/03   MRN:  [id number]   Chief Complaint: Diabetes and Hypertension  Diabetes She presents for her follow-up diabetic visit. She has type 2 diabetes mellitus. Pertinent negatives for hypoglycemia include no headaches or tremors. Pertinent negatives for diabetes include no chest pain, no fatigue, no polydipsia and no polyuria. Current diabetic treatment includes oral agent (dual therapy) (metformin and Rybelsus). She is compliant with treatment all of the time.  Hypertension This is a chronic problem. The problem is controlled. Pertinent negatives include no chest pain, headaches, palpitations or shortness of breath. Past treatments include ACE inhibitors.  Kidney stone - recent lithotripsy; stent to be removed today by the patient but she is very anxious about it.   Lab Results  Component Value Date   NA 140 04/10/2023   K 3.8 04/10/2023   CO2 23 04/10/2023   GLUCOSE 118 (H) 04/10/2023   BUN 18 04/10/2023   CREATININE 0.91 04/10/2023   CALCIUM 9.7 04/10/2023   EGFR 86 09/11/2022   GFRNONAA >60 04/10/2023   Lab Results  Component Value Date   CHOL 143 09/11/2022   HDL 67 09/11/2022   LDLCALC 65 09/11/2022   TRIG 47 09/11/2022   CHOLHDL 2.0 09/09/2021   Lab Results  Component Value Date   TSH 1.63 09/11/2022   Lab Results  Component Value Date   HGBA1C 6.3 (A) 04/30/2023   Lab Results  Component Value Date   WBC 9.0 04/10/2023   HGB 11.6 (L) 04/10/2023   HCT 37.9 04/10/2023   MCV 84.6 04/10/2023   PLT 228 04/10/2023   Lab Results  Component Value Date   ALT 17 04/10/2023   AST 26 04/10/2023   ALKPHOS 55 04/10/2023   BILITOT 0.7 04/10/2023   Lab Results  Component Value Date   VD25OH 22.49 (L) 09/06/2020     Review of Systems  Constitutional:  Negative for appetite change, fatigue, fever and unexpected weight change.  HENT:  Negative for tinnitus and trouble swallowing.   Eyes:   Negative for visual disturbance.  Respiratory:  Negative for cough, chest tightness and shortness of breath.   Cardiovascular:  Negative for chest pain, palpitations and leg swelling.  Gastrointestinal:  Negative for abdominal pain.  Endocrine: Negative for polydipsia and polyuria.  Genitourinary:  Negative for dysuria and hematuria.  Musculoskeletal:  Negative for arthralgias.  Neurological:  Negative for tremors, numbness and headaches.  Psychiatric/Behavioral:  Negative for dysphoric mood.     Patient Active Problem List   Diagnosis Date Noted   Hematuria 09/09/2021   S/P total hysterectomy and bilateral salpingo-oophorectomy 09/05/2020   Epicondylitis, lateral, right 09/26/2017   Type II diabetes mellitus with complication (HCC) 05/22/2017   Hyperlipidemia associated with type 2 diabetes mellitus (HCC) 05/22/2017   Essential hypertension 05/22/2017   Renal stones 05/22/2017    Allergies  Allergen Reactions   Codeine     Past Surgical History:  Procedure Laterality Date   ABDOMINAL HYSTERECTOMY  07/28/2016   total due to fibroids   COLONOSCOPY WITH PROPOFOL N/A 01/21/2021   Procedure: COLONOSCOPY WITH PROPOFOL;  Surgeon: Regis Bill, MD;  Location: [facility] ENDOSCOPY;  Service: Endoscopy;  Laterality: N/A;   CYSTOSCOPY/URETEROSCOPY/HOLMIUM LASER/STENT PLACEMENT Right 04/27/2023   Procedure: CYSTOSCOPY/URETEROSCOPY/HOLMIUM LASER/STENT PLACEMENT;  Surgeon: Sondra Come, MD;  Location: [facility] ORS;  Service: Urology;  Laterality: Right;   TONSILLECTOMY      Social History   Tobacco  Use   Smoking status: Never   Smokeless tobacco: Never  Vaping Use   Vaping Use: Never used  Substance Use Topics   Alcohol use: Not Currently    Alcohol/week: 1.0 standard drink of alcohol    Types: 1 Standard drinks or equivalent per week    Comment: occassionally   Drug use: No     Medication list has been reviewed and updated.  Current Meds  Medication Sig   glucose blood  test strip 1 each by Other route as needed for other. Use as instructed   ipratropium (ATROVENT) 0.06 % nasal spray Place 2 sprays into both nostrils 4 (four) times daily. (Patient taking differently: Place 2 sprays into both nostrils as needed.)   methocarbamol (ROBAXIN) 500 MG tablet Take 1 tablet (500 mg total) by mouth at bedtime. (Patient taking differently: Take 500 mg by mouth as needed.)   [DISCONTINUED] lisinopril (ZESTRIL) 10 MG tablet Take 1 tablet (10 mg total) by mouth daily.   [DISCONTINUED] metFORMIN (GLUCOPHAGE) 1000 MG tablet TAKE 1 TABLET BY MOUTH TWICE DAILY WITH MEALS   [DISCONTINUED] rosuvastatin (CRESTOR) 10 MG tablet Take 1 tablet by mouth once daily   [DISCONTINUED] Semaglutide (RYBELSUS) 3 MG TABS Take 1 tablet by mouth once daily       04/17/2023    9:05 AM 10/17/2022    2:43 PM 09/11/2022   10:50 AM 01/10/2022    9:16 AM  GAD 7 : Generalized Anxiety Score  Nervous, Anxious, on Edge 0 0 0 0  Control/stop worrying 0 0 0 0  Worry too much - different things 0 0 0 0  Trouble relaxing 0 0 0 0  Restless 0 0 0 0  Easily annoyed or irritable 0 0 0 0  Afraid - awful might happen 0 0 0 0  Total GAD 7 Score 0 0 0 0  Anxiety Difficulty Not difficult at all Not difficult at all Not difficult at all Not difficult at all       04/17/2023    9:05 AM 10/17/2022    2:43 PM 09/11/2022   10:50 AM  Depression screen PHQ 2/9  Decreased Interest 0 0 0  Down, Depressed, Hopeless 0 0 0  PHQ - 2 Score 0 0 0  Altered sleeping 0 0 0  Tired, decreased energy 3 0 0  Change in appetite 3 0 0  Feeling bad or failure about yourself  0 0 0  Trouble concentrating 0 0 0  Moving slowly or fidgety/restless 0 0 0  Suicidal thoughts 0 0 0  PHQ-9 Score 6 0 0  Difficult doing work/chores Not difficult at all Not difficult at all Not difficult at all    BP Readings from Last 3 Encounters:  04/30/23 122/70  04/27/23 136/81  04/20/23 (!) 147/85    Physical Exam Vitals and nursing  note reviewed.  Constitutional:      General: She is not in acute distress.    Appearance: She is well-developed.  HENT:     Head: Normocephalic and atraumatic.  Cardiovascular:     Rate and Rhythm: Normal rate and regular rhythm.     Pulses: Normal pulses.  Pulmonary:     Effort: Pulmonary effort is normal. No respiratory distress.     Breath sounds: No wheezing or rhonchi.  Musculoskeletal:     Cervical back: Normal range of motion.     Right lower leg: No edema.     Left lower leg: No edema.  Lymphadenopathy:  Cervical: No cervical adenopathy.  Skin:    General: Skin is warm and dry.     Findings: No lesion or rash.  Neurological:     Mental Status: She is alert and oriented to person, place, and time.  Psychiatric:        Mood and Affect: Mood normal.        Behavior: Behavior normal.     Wt Readings from Last 3 Encounters:  04/30/23 131 lb (59.4 kg)  04/27/23 130 lb (59 kg)  04/20/23 124 lb (56.2 kg)    BP 122/70   Pulse 96   Ht 5\' 1"  (1.549 m)   Wt 131 lb (59.4 kg)   LMP  (LMP Unknown)   SpO2 100%   BMI 24.75 kg/m   Assessment and Plan:  Problem List Items Addressed This Visit     Type II diabetes mellitus with complication (HCC) (Chronic)    Blood sugars stable without hypoglycemic symptoms or events. Currently being treated with metformin and Rybelsus. Lab Results  Component Value Date   HGBA1C 6.9 09/11/2022  A1C today is 6.3.  continue same regimen.       Relevant Medications   metFORMIN (GLUCOPHAGE) 1000 MG tablet   Semaglutide (RYBELSUS) 3 MG TABS   rosuvastatin (CRESTOR) 10 MG tablet   lisinopril (ZESTRIL) 10 MG tablet   Other Relevant Orders   POCT glycosylated hemoglobin (Hb A1C) (Completed)   Renal stones   Essential hypertension - Primary (Chronic)    Stable exam with well controlled BP.  Currently taking lisinopril. Tolerating medications without concerns or side effects. Will continue to recommend low sodium diet and current  regimen.       Relevant Medications   rosuvastatin (CRESTOR) 10 MG tablet   lisinopril (ZESTRIL) 10 MG tablet   Other Visit Diagnoses     Mixed hyperlipidemia       Relevant Medications   rosuvastatin (CRESTOR) 10 MG tablet   lisinopril (ZESTRIL) 10 MG tablet       Return in about 4 months (around 08/30/2023) for DM, HTN.   Partially dictated using Dragon software, any errors are not intentional.  Reubin Milan, MD [facility] [place], [place]

## 2023-04-30 NOTE — Anesthesia Postprocedure Evaluation (Signed)
Anesthesia Post Note  Patient: Teresa Rivas  Procedure(s) Performed: CYSTOSCOPY/URETEROSCOPY/HOLMIUM LASER/STENT PLACEMENT (Right)  Patient location during evaluation: PACU Anesthesia Type: General Level of consciousness: awake and alert Pain management: pain level controlled Vital Signs Assessment: post-procedure vital signs reviewed and stable Respiratory status: spontaneous breathing, nonlabored ventilation, respiratory function stable and patient connected to nasal cannula oxygen Cardiovascular status: blood pressure returned to baseline and stable Postop Assessment: no apparent nausea or vomiting Anesthetic complications: no   There were no known notable events for this encounter.   Last Vitals:  Vitals:   04/27/23 1615 04/27/23 1627  BP: 133/71 136/81  Pulse: 62 86  Resp: 10 14  Temp: (!) 36.4 C 36.4 C  SpO2: 100% 100%    Last Pain:  Vitals:   04/27/23 1627  TempSrc: Temporal  PainSc:                  Stephanie Coup

## 2023-04-30 NOTE — Assessment & Plan Note (Signed)
Stable exam with well controlled BP.  Currently taking lisinopril. Tolerating medications without concerns or side effects. Will continue to recommend low sodium diet and current regimen.  

## 2023-05-01 NOTE — Telephone Encounter (Signed)
Pt LMOM again today asking about work note.  She is also on [facility], but requested letter to be faxed.

## 2023-06-06 DIAGNOSIS — R198 Other specified symptoms and signs involving the digestive system and abdomen: Secondary | ICD-10-CM | POA: Diagnosis not present

## 2023-06-06 DIAGNOSIS — R1010 Upper abdominal pain, unspecified: Secondary | ICD-10-CM | POA: Diagnosis not present

## 2023-06-06 DIAGNOSIS — R140 Abdominal distension (gaseous): Secondary | ICD-10-CM | POA: Diagnosis not present

## 2023-08-17 ENCOUNTER — Encounter: Payer: Self-pay | Admitting: *Deleted

## 2023-08-24 ENCOUNTER — Encounter
Admission: RE | Disposition: A | Discharge status: Home / Self Care | Payer: Self-pay | Source: Home / Self Care | Attending: Gastroenterology

## 2023-08-24 ENCOUNTER — Encounter: Payer: Self-pay | Admitting: *Deleted

## 2023-08-24 ENCOUNTER — Ambulatory Visit
Admission: RE | Admit: 2023-08-24 | Discharge: 2023-08-24 | Disposition: A | Discharge status: Home / Self Care | Payer: BC Managed Care – PPO | Attending: Gastroenterology | Admitting: Gastroenterology

## 2023-08-24 ENCOUNTER — Ambulatory Visit: Payer: BC Managed Care – PPO | Admitting: Certified Registered"

## 2023-08-24 DIAGNOSIS — Z7984 Long term (current) use of oral hypoglycemic drugs: Secondary | ICD-10-CM | POA: Insufficient documentation

## 2023-08-24 DIAGNOSIS — E119 Type 2 diabetes mellitus without complications: Secondary | ICD-10-CM | POA: Insufficient documentation

## 2023-08-24 DIAGNOSIS — I10 Essential (primary) hypertension: Secondary | ICD-10-CM | POA: Diagnosis not present

## 2023-08-24 DIAGNOSIS — R109 Unspecified abdominal pain: Secondary | ICD-10-CM | POA: Diagnosis not present

## 2023-08-24 DIAGNOSIS — E785 Hyperlipidemia, unspecified: Secondary | ICD-10-CM | POA: Insufficient documentation

## 2023-08-24 DIAGNOSIS — R1013 Epigastric pain: Secondary | ICD-10-CM | POA: Insufficient documentation

## 2023-08-24 HISTORY — PX: ESOPHAGOGASTRODUODENOSCOPY (EGD) WITH PROPOFOL: SHX5813

## 2023-08-24 LAB — GLUCOSE, CAPILLARY: Glucose-Capillary: 97 mg/dL (ref 70–99)

## 2023-08-24 SURGERY — ESOPHAGOGASTRODUODENOSCOPY (EGD) WITH PROPOFOL
Anesthesia: General

## 2023-08-24 MED ORDER — DEXMEDETOMIDINE HCL IN NACL 80 MCG/20ML IV SOLN
INTRAVENOUS | Status: DC | PRN
  Administered 2023-08-24: 8 ug via INTRAVENOUS

## 2023-08-24 MED ORDER — PROPOFOL 500 MG/50ML IV EMUL
INTRAVENOUS | Status: DC | PRN
  Administered 2023-08-24: 175 ug/kg/min via INTRAVENOUS

## 2023-08-24 MED ORDER — PROPOFOL 10 MG/ML IV BOLUS
INTRAVENOUS | Status: DC | PRN
  Administered 2023-08-24: 100 mg via INTRAVENOUS

## 2023-08-24 MED ORDER — LIDOCAINE HCL (CARDIAC) PF 100 MG/5ML IV SOSY
PREFILLED_SYRINGE | INTRAVENOUS | Status: DC | PRN
  Administered 2023-08-24: 40 mg via INTRAVENOUS

## 2023-08-24 MED ORDER — SODIUM CHLORIDE 0.9 % IV SOLN: INTRAVENOUS | Status: DC

## 2023-08-24 NOTE — Transfer of Care (Signed)
Immediate Anesthesia Transfer of Care Note  Patient: Teresa Rivas  Procedure(s) Performed: ESOPHAGOGASTRODUODENOSCOPY (EGD) WITH PROPOFOL  Patient Location: Endoscopy Unit  Anesthesia Type:General  Level of Consciousness: drowsy and patient cooperative  Airway & Oxygen Therapy: Patient Spontanous Breathing and Patient connected to nasal cannula oxygen  Post-op Assessment: Report given to RN, Post -op Vital signs reviewed and stable, and Patient moving all extremities X 4  Post vital signs: Reviewed and stable  Last Vitals:  Vitals Value Taken Time  BP 89/47 08/24/23 0943  Temp    Pulse 94 08/24/23 0944  Resp    SpO2 100 % 08/24/23 0944  Vitals shown include unfiled device data.  Last Pain:  Vitals:   08/24/23 0943  TempSrc:   PainSc: Asleep         Complications: No notable events documented.

## 2023-08-24 NOTE — Anesthesia Preprocedure Evaluation (Signed)
Anesthesia Evaluation  Patient identified by MRN, date of birth, ID band Patient awake    Reviewed: Allergy & Precautions, NPO status , Patient's Chart, lab work & pertinent test results  Airway Mallampati: III  TM Distance: >3 FB Neck ROM: full    Dental  (+) Dental Advidsory Given, Chipped   Pulmonary neg pulmonary ROS, neg COPD   Pulmonary exam normal        Cardiovascular hypertension, (-) Past MI negative cardio ROS Normal cardiovascular exam(-) dysrhythmias      Neuro/Psych negative neurological ROS  negative psych ROS   GI/Hepatic negative GI ROS, Neg liver ROS,,,  Endo/Other  negative endocrine ROSdiabetes    Renal/GU      Musculoskeletal   Abdominal   Peds  Hematology negative hematology ROS (+)   Anesthesia Other Findings Past Medical History: No date: Anemia No date: Diabetes mellitus without complication (HCC) No date: Headache No date: History of kidney stones No date: Hyperlipidemia No date: Hypertension 04/27/2017: Kidney stones  Past Surgical History: 07/28/2016: ABDOMINAL HYSTERECTOMY     Comment:  total due to fibroids 01/21/2021: COLONOSCOPY WITH PROPOFOL; N/A     Comment:  Procedure: COLONOSCOPY WITH PROPOFOL;  Surgeon:               Regis Bill, MD;  Location: [facility] ENDOSCOPY;                Service: Endoscopy;  Laterality: N/A; No date: TONSILLECTOMY  BMI    Body Mass Index: 24.56 kg/m      Reproductive/Obstetrics negative OB ROS                             Anesthesia Physical Anesthesia Plan  ASA: 2  Anesthesia Plan: General   Post-op Pain Management: Minimal or no pain anticipated   Induction: Intravenous  PONV Risk Score and Plan: 3 and Propofol infusion, TIVA and Ondansetron  Airway Management Planned: Nasal Cannula  Additional Equipment: None  Intra-op Plan:   Post-operative Plan:   Informed Consent: I have reviewed the  patients History and Physical, chart, labs and discussed the procedure including the risks, benefits and alternatives for the proposed anesthesia with the patient or authorized representative who has indicated his/her understanding and acceptance.     Dental advisory given  Plan Discussed with: CRNA and Surgeon  Anesthesia Plan Comments: (Discussed risks of anesthesia with patient, including possibility of difficulty with spontaneous ventilation under anesthesia necessitating airway intervention, PONV, and rare risks such as cardiac or respiratory or neurological events, and allergic reactions. Discussed the role of CRNA in patient's perioperative care. Patient understands.)       Anesthesia Quick Evaluation

## 2023-08-24 NOTE — Op Note (Signed)
[facility] Gastroenterology Patient Name: Teresa Rivas Procedure Date: 08/24/2023 9:16 AM MRN: [id number] Account #: [id number] Date of Birth: Apr 04, 1966 Admit Type: Outpatient Age: 57 Room: [facility] ENDO ROOM 3 Gender: Female Note Status: Finalized Instrument Name: Upper Endoscope [phone] Procedure:             Upper GI endoscopy Indications:           Epigastric abdominal pain Providers:             Eather Colas MD, MD Referring MD:          Bari Edward, MD (Referring MD) Medicines:             Monitored Anesthesia Care Complications:         No immediate complications. Estimated blood loss:                         Minimal. Procedure:             Pre-Anesthesia Assessment:                        - Prior to the procedure, a History and Physical was                         performed, and patient medications and allergies were                         reviewed. The patient is competent. The risks and                         benefits of the procedure and the sedation options and                         risks were discussed with the patient. All questions                         were answered and informed consent was obtained.                         Patient identification and proposed procedure were                         verified by the physician, the nurse, the                         anesthesiologist, the anesthetist and the technician                         in the endoscopy suite. Mental Status Examination:                         alert and oriented. Airway Examination: normal                         oropharyngeal airway and neck mobility. Respiratory                         Examination: clear to auscultation. CV Examination:  normal. Prophylactic Antibiotics: The patient does not                         require prophylactic antibiotics. Prior                         Anticoagulants: The patient has taken no anticoagulant                          or antiplatelet agents. ASA Grade Assessment: II - A                         patient with mild systemic disease. After reviewing                         the risks and benefits, the patient was deemed in                         satisfactory condition to undergo the procedure. The                         anesthesia plan was to use monitored anesthesia care                         (MAC). Immediately prior to administration of                         medications, the patient was re-assessed for adequacy                         to receive sedatives. The heart rate, respiratory                         rate, oxygen saturations, blood pressure, adequacy of                         pulmonary ventilation, and response to care were                         monitored throughout the procedure. The physical                         status of the patient was re-assessed after the                         procedure.                        After obtaining informed consent, the endoscope was                         passed under direct vision. Throughout the procedure,                         the patient's blood pressure, pulse, and oxygen                         saturations were monitored continuously. The Endoscope  was introduced through the mouth, and advanced to the                         second part of duodenum. The upper GI endoscopy was                         somewhat difficult due to the patient's inability to                         tolerate conscious sedation. The patient tolerated the                         procedure well. Findings:      The examined esophagus was normal.      The entire examined stomach was normal. Biopsies were taken with a cold       forceps for Helicobacter pylori testing. Estimated blood loss was       minimal.      The examined duodenum was normal. Biopsies were taken with a cold       forceps for Helicobacter pylori testing.  Estimated blood loss was       minimal. Impression:            - Normal esophagus.                        - Normal stomach. Biopsied.                        - Normal examined duodenum. Biopsied. Recommendation:        - Discharge patient to home.                        - Resume previous diet.                        - Continue present medications.                        - Await pathology results.                        - Return to referring physician as previously                         scheduled. Procedure Code(s):     --- Professional ---                        [phone], Esophagogastroduodenoscopy, flexible,                         transoral; with biopsy, single or multiple Diagnosis Code(s):     --- Professional ---                        R10.13, Epigastric pain CPT copyright 2022 American Medical Association. All rights reserved. The codes documented in this report are preliminary and upon coder review may  be revised to meet current compliance requirements. Eather Colas MD, MD 08/24/2023 9:44:24 AM Number of Addenda: 0 Note Initiated On: 08/24/2023 9:16 AM Estimated Blood Loss:  Estimated blood loss was minimal.  [facility]

## 2023-08-24 NOTE — Anesthesia Postprocedure Evaluation (Signed)
Anesthesia Post Note  Patient: Teresa Rivas  Procedure(s) Performed: ESOPHAGOGASTRODUODENOSCOPY (EGD) WITH PROPOFOL  Patient location during evaluation: Endoscopy Anesthesia Type: General Level of consciousness: awake and alert Pain management: pain level controlled Vital Signs Assessment: post-procedure vital signs reviewed and stable Respiratory status: spontaneous breathing, nonlabored ventilation, respiratory function stable and patient connected to nasal cannula oxygen Cardiovascular status: blood pressure returned to baseline and stable Postop Assessment: no apparent nausea or vomiting Anesthetic complications: no  No notable events documented.   Last Vitals:  Vitals:   08/24/23 0946 08/24/23 0953  BP: (!) 96/51 106/62  Pulse: 87 85  Resp:    Temp:    SpO2: 100% 100%    Last Pain:  Vitals:   08/24/23 0943  TempSrc:   PainSc: Asleep                 Stephanie Coup

## 2023-08-24 NOTE — Interval H&P Note (Signed)
History and Physical Interval Note:  08/24/2023 9:25 AM  Teresa Rivas  has presented today for surgery, with the diagnosis of Abdominal Pain.  The various methods of treatment have been discussed with the patient and family. After consideration of risks, benefits and other options for treatment, the patient has consented to  Procedure(s): ESOPHAGOGASTRODUODENOSCOPY (EGD) WITH PROPOFOL (N/A) as a surgical intervention.  The patient's history has been reviewed, patient examined, no change in status, stable for surgery.  I have reviewed the patient's chart and labs.  Questions were answered to the patient's satisfaction.     Regis Bill  Ok to proceed with EGD

## 2023-08-24 NOTE — H&P (Signed)
Outpatient short stay form Pre-procedure 08/24/2023  Regis Bill, MD  Primary Physician: Reubin Milan, MD  Reason for visit:  Epigastric pain  History of present illness:    57 y/o lady with history of hypertension, DM II, and HLD here for EGD for epigastric pain. No blood thinners. No significant neck or abdominal surgeries. Mother with colon cancer in her 4's.    Current Facility-Administered Medications:    0.9 %  sodium chloride infusion, , Intravenous, Continuous, Chloe Flis, Rossie Muskrat, MD, Last Rate: 20 mL/hr at 08/24/23 0908, New Bag at 08/24/23 0908  Medications Prior to Admission  Medication Sig Dispense Refill Last Dose   lisinopril (ZESTRIL) 10 MG tablet Take 1 tablet (10 mg total) by mouth daily. 90 tablet 1 08/24/2023   metFORMIN (GLUCOPHAGE) 1000 MG tablet Take 1 tablet (1,000 mg total) by mouth 2 (two) times daily with a meal. 180 tablet 1 08/23/2023   methocarbamol (ROBAXIN) 500 MG tablet Take 1 tablet (500 mg total) by mouth at bedtime. (Patient taking differently: Take 500 mg by mouth as needed.) 30 tablet 2 Past Week   rosuvastatin (CRESTOR) 10 MG tablet Take 1 tablet (10 mg total) by mouth daily. 90 tablet 1 08/23/2023   glucose blood test strip 1 each by Other route as needed for other. Use as instructed      ipratropium (ATROVENT) 0.06 % nasal spray Place 2 sprays into both nostrils 4 (four) times daily. (Patient taking differently: Place 2 sprays into both nostrils as needed.) 15 mL 12    Semaglutide (RYBELSUS) 3 MG TABS Take 1 tablet (3 mg total) by mouth daily. 30 tablet 5 08/20/2023     Allergies  Allergen Reactions   Codeine      Past Medical History:  Diagnosis Date   Anemia    Diabetes mellitus without complication (HCC)    Headache    History of kidney stones    Hyperlipidemia    Hypertension    Kidney stones 04/27/2017    Review of systems:  Otherwise negative.    Physical Exam  Gen: Alert, oriented. Appears stated age.  HEENT:  PERRLA. Lungs: No respiratory distress CV: RRR Abd: soft, benign, no masses Ext: No edema    Planned procedures: Proceed with EGD. The patient understands the nature of the planned procedure, indications, risks, alternatives and potential complications including but not limited to bleeding, infection, perforation, damage to internal organs and possible oversedation/side effects from anesthesia. The patient agrees and gives consent to proceed.  Please refer to procedure notes for findings, recommendations and patient disposition/instructions.     Regis Bill, MD [facility] Gastroenterology

## 2023-08-27 ENCOUNTER — Encounter: Payer: Self-pay | Admitting: Gastroenterology

## 2023-08-27 LAB — SURGICAL PATHOLOGY

## 2023-08-30 ENCOUNTER — Encounter: Payer: Self-pay | Admitting: Internal Medicine

## 2023-08-30 ENCOUNTER — Ambulatory Visit: Payer: BC Managed Care – PPO | Admitting: Internal Medicine

## 2023-08-30 VITALS — BP 136/80 | HR 81 | Ht 61.0 in | Wt 137.0 lb

## 2023-08-30 DIAGNOSIS — R1010 Upper abdominal pain, unspecified: Secondary | ICD-10-CM

## 2023-08-30 DIAGNOSIS — E118 Type 2 diabetes mellitus with unspecified complications: Secondary | ICD-10-CM | POA: Diagnosis not present

## 2023-08-30 DIAGNOSIS — Z23 Encounter for immunization: Secondary | ICD-10-CM | POA: Diagnosis not present

## 2023-08-30 DIAGNOSIS — Z1231 Encounter for screening mammogram for malignant neoplasm of breast: Secondary | ICD-10-CM | POA: Diagnosis not present

## 2023-08-30 DIAGNOSIS — R109 Unspecified abdominal pain: Secondary | ICD-10-CM | POA: Insufficient documentation

## 2023-08-30 DIAGNOSIS — Z7984 Long term (current) use of oral hypoglycemic drugs: Secondary | ICD-10-CM

## 2023-08-30 LAB — POCT GLYCOSYLATED HEMOGLOBIN (HGB A1C): Hemoglobin A1C: 6.5 % — AB (ref 4.0–5.6)

## 2023-08-30 NOTE — Assessment & Plan Note (Addendum)
Per GI note: intermittent bloating and abdominal pain, gassiness. There are some irregular bowel habits- moves bowels about 2-3 times weekly. There are intermittent loose stools. Not taking any fiber/stool softeners. Also notes a return of intermittent upper abdominal pain and nausea without vomiting that she has had in past. Denies any heartburn/dysphagia, melena/hematochezia, weight loss, further GI concerns. She denies tobacco/regular nsaids. Her global gi symptoms seem to have started around 2020 at which time she thinks she started rybelsus. She is also on metformin.  Trio Loss adjuster, chartered done. - negative EGD - normal with negative biopsy She has symptoms intermittently so doubt either metformin or Rybelsus as the cause of her symptoms. She will see GI in a few weeks for recommendations.

## 2023-08-30 NOTE — Progress Notes (Signed)
Date:  08/30/2023   Name:  Teresa Rivas   DOB:  1966/10/31   MRN:  [id number]   Chief Complaint: Diabetes and Hypertension  Abdominal Pain This is a chronic problem. The problem has been unchanged (it comes and goes - last flare up was about 2 months ago). The pain is located in the epigastric region. The pain is moderate. The quality of the pain is colicky and cramping. The abdominal pain does not radiate. Nothing aggravates the pain. Relieved by: Gas-X. Prior diagnostic workup includes GI consult and upper endoscopy (seeing GI later this month for follow up).  Diabetes She presents for her follow-up diabetic visit. She has type 2 diabetes mellitus. Her disease course has been stable (FSBS 90-100). There are no hypoglycemic associated symptoms. Pertinent negatives for diabetes include no blurred vision, no foot paresthesias, no foot ulcerations, no visual change and no weakness. Symptoms are stable. Current diabetic treatments: metformin and Rybelsus. She is compliant with treatment all of the time. She participates in exercise daily. There is no change in her home blood glucose trend. An ACE inhibitor/angiotensin II receptor blocker is being taken. Eye exam is not current.    Lab Results  Component Value Date   NA 140 04/10/2023   K 3.8 04/10/2023   CO2 23 04/10/2023   GLUCOSE 118 (H) 04/10/2023   BUN 18 04/10/2023   CREATININE 0.91 04/10/2023   CALCIUM 9.7 04/10/2023   EGFR 86 09/11/2022   GFRNONAA >60 04/10/2023   Lab Results  Component Value Date   CHOL 143 09/11/2022   HDL 67 09/11/2022   LDLCALC 65 09/11/2022   TRIG 47 09/11/2022   CHOLHDL 2.0 09/09/2021   Lab Results  Component Value Date   TSH 1.63 09/11/2022   Lab Results  Component Value Date   HGBA1C 6.3 (A) 04/30/2023   Lab Results  Component Value Date   WBC 9.0 04/10/2023   HGB 11.6 (L) 04/10/2023   HCT 37.9 04/10/2023   MCV 84.6 04/10/2023   PLT 228 04/10/2023   Lab Results  Component  Value Date   ALT 17 04/10/2023   AST 26 04/10/2023   ALKPHOS 55 04/10/2023   BILITOT 0.7 04/10/2023   Lab Results  Component Value Date   VD25OH 22.49 (L) 09/06/2020     Patient Active Problem List   Diagnosis Date Noted   Abdominal pain 08/30/2023   Hematuria 09/09/2021   S/P total hysterectomy and bilateral salpingo-oophorectomy 09/05/2020   Epicondylitis, lateral, right 09/26/2017   Type II diabetes mellitus with complication (HCC) 05/22/2017   Hyperlipidemia associated with type 2 diabetes mellitus (HCC) 05/22/2017   Essential hypertension 05/22/2017   Renal stones 05/22/2017    Allergies  Allergen Reactions   Codeine     Past Surgical History:  Procedure Laterality Date   ABDOMINAL HYSTERECTOMY  07/28/2016   total due to fibroids   COLONOSCOPY WITH PROPOFOL N/A 01/21/2021   Procedure: COLONOSCOPY WITH PROPOFOL;  Surgeon: Regis Bill, MD;  Location: [facility] ENDOSCOPY;  Service: Endoscopy;  Laterality: N/A;   CYSTOSCOPY/URETEROSCOPY/HOLMIUM LASER/STENT PLACEMENT Right 04/27/2023   Procedure: CYSTOSCOPY/URETEROSCOPY/HOLMIUM LASER/STENT PLACEMENT;  Surgeon: Sondra Come, MD;  Location: [facility] ORS;  Service: Urology;  Laterality: Right;   ESOPHAGOGASTRODUODENOSCOPY (EGD) WITH PROPOFOL N/A 08/24/2023   Procedure: ESOPHAGOGASTRODUODENOSCOPY (EGD) WITH PROPOFOL;  Surgeon: Regis Bill, MD;  Location: [facility] ENDOSCOPY;  Service: Endoscopy;  Laterality: N/A;   TONSILLECTOMY      Social History   Tobacco Use  Smoking status: Never   Smokeless tobacco: Never  Vaping Use   Vaping status: Never Used  Substance Use Topics   Alcohol use: Not Currently    Alcohol/week: 1.0 standard drink of alcohol    Types: 1 Standard drinks or equivalent per week    Comment: occassionally   Drug use: No     Medication list has been reviewed and updated.  Current Meds  Medication Sig   glucose blood test strip 1 each by Other route as needed for other. Use as instructed    ipratropium (ATROVENT) 0.06 % nasal spray Place 2 sprays into both nostrils 4 (four) times daily. (Patient taking differently: Place 2 sprays into both nostrils as needed.)   lisinopril (ZESTRIL) 10 MG tablet Take 1 tablet (10 mg total) by mouth daily.   metFORMIN (GLUCOPHAGE) 1000 MG tablet Take 1 tablet (1,000 mg total) by mouth 2 (two) times daily with a meal.   methocarbamol (ROBAXIN) 500 MG tablet Take 1 tablet (500 mg total) by mouth at bedtime. (Patient taking differently: Take 500 mg by mouth as needed.)   rosuvastatin (CRESTOR) 10 MG tablet Take 1 tablet (10 mg total) by mouth daily.   Semaglutide (RYBELSUS) 3 MG TABS Take 1 tablet (3 mg total) by mouth daily.       08/30/2023   11:01 AM 04/17/2023    9:05 AM 10/17/2022    2:43 PM 09/11/2022   10:50 AM  GAD 7 : Generalized Anxiety Score  Nervous, Anxious, on Edge 0 0 0 0  Control/stop worrying 0 0 0 0  Worry too much - different things 0 0 0 0  Trouble relaxing 0 0 0 0  Restless 0 0 0 0  Easily annoyed or irritable 0 0 0 0  Afraid - awful might happen 0 0 0 0  Total GAD 7 Score 0 0 0 0  Anxiety Difficulty Not difficult at all Not difficult at all Not difficult at all Not difficult at all       08/30/2023   11:01 AM 04/17/2023    9:05 AM 10/17/2022    2:43 PM  Depression screen PHQ 2/9  Decreased Interest 0 0 0  Down, Depressed, Hopeless 0 0 0  PHQ - 2 Score 0 0 0  Altered sleeping 0 0 0  Tired, decreased energy 1 3 0  Change in appetite 0 3 0  Feeling bad or failure about yourself  0 0 0  Trouble concentrating 0 0 0  Moving slowly or fidgety/restless 0 0 0  Suicidal thoughts 0 0 0  PHQ-9 Score 1 6 0  Difficult doing work/chores Not difficult at all Not difficult at all Not difficult at all    BP Readings from Last 3 Encounters:  08/30/23 136/80  08/24/23 106/62  04/30/23 122/70    Physical Exam Vitals and nursing note reviewed.  Constitutional:      General: She is not in acute distress.    Appearance: She  is well-developed.  HENT:     Head: Normocephalic and atraumatic.  Neck:     Vascular: No carotid bruit.  Cardiovascular:     Rate and Rhythm: Normal rate and regular rhythm.  Pulmonary:     Effort: Pulmonary effort is normal. No respiratory distress.     Breath sounds: No wheezing or rhonchi.  Lymphadenopathy:     Cervical: No cervical adenopathy.  Skin:    General: Skin is warm and dry.     Findings: No rash.  Neurological:     General: No focal deficit present.     Mental Status: She is alert and oriented to person, place, and time.  Psychiatric:        Mood and Affect: Mood normal.        Behavior: Behavior normal.    Diabetic Foot Exam - Simple   Simple Foot Form Diabetic Foot exam was performed with the following findings: Yes 08/30/2023 11:12 AM  Visual Inspection No deformities, no ulcerations, no other skin breakdown bilaterally: Yes Sensation Testing Intact to touch and monofilament testing bilaterally: Yes Pulse Check Posterior Tibialis and Dorsalis pulse intact bilaterally: Yes Comments Callous inner right heel      Wt Readings from Last 3 Encounters:  08/30/23 137 lb (62.1 kg)  08/24/23 139 lb (63 kg)  04/30/23 131 lb (59.4 kg)    BP 136/80   Pulse 81   Ht 5\' 1"  (1.549 m)   Wt 137 lb (62.1 kg)   LMP  (LMP Unknown)   SpO2 98%   BMI 25.89 kg/m   Assessment and Plan:  Problem List Items Addressed This Visit       Unprioritized   Abdominal pain    Per GI note: intermittent bloating and abdominal pain, gassiness. There are some irregular bowel habits- moves bowels about 2-3 times weekly. There are intermittent loose stools. Not taking any fiber/stool softeners. Also notes a return of intermittent upper abdominal pain and nausea without vomiting that she has had in past. Denies any heartburn/dysphagia, melena/hematochezia, weight loss, further GI concerns. She denies tobacco/regular nsaids. Her global gi symptoms seem to have started around 2020 at  which time she thinks she started rybelsus. She is also on metformin.  Trio Loss adjuster, chartered done. - negative EGD - normal with negative biopsy She has symptoms intermittently so doubt either metformin or Rybelsus as the cause of her symptoms. She will see GI in a few weeks for recommendations.       Type II diabetes mellitus with complication (HCC) - Primary (Chronic)    Blood sugars stable without hypoglycemic symptoms or events. Current regimen is Rybelsus and metformin. Changes made last visit are none. Lab Results  Component Value Date   HGBA1C 6.3 (A) 04/30/2023  A1C today = 6.5 which is acceptable.  No change in current regimen recommended.       Relevant Orders   POCT glycosylated hemoglobin (Hb A1C)   Other Visit Diagnoses     Encounter for screening mammogram for breast cancer       Relevant Orders   MM 3D SCREENING MAMMOGRAM BILATERAL BREAST   Need for influenza vaccination       Relevant Orders   Flu vaccine trivalent PF, 6mos and older(Flulaval,Afluria,Fluarix,Fluzone) (Completed)       No follow-ups on file.    Reubin Milan, MD [facility] [place]

## 2023-08-30 NOTE — Assessment & Plan Note (Addendum)
Blood sugars stable without hypoglycemic symptoms or events. Current regimen is Rybelsus and metformin. Changes made last visit are none. Lab Results  Component Value Date   HGBA1C 6.3 (A) 04/30/2023  A1C today = 6.5 which is acceptable.  No change in current regimen recommended.

## 2023-09-14 ENCOUNTER — Encounter: Payer: BC Managed Care – PPO | Admitting: Internal Medicine

## 2023-10-12 ENCOUNTER — Encounter: Payer: Self-pay | Admitting: Internal Medicine

## 2023-10-12 NOTE — Telephone Encounter (Signed)
 Care team updated and letter sent for eye exam notes.

## 2023-10-15 ENCOUNTER — Encounter: Payer: Self-pay | Admitting: Internal Medicine

## 2023-10-15 LAB — HM DIABETES EYE EXAM

## 2023-10-23 ENCOUNTER — Ambulatory Visit (INDEPENDENT_AMBULATORY_CARE_PROVIDER_SITE_OTHER): Payer: BC Managed Care – PPO | Admitting: Internal Medicine

## 2023-10-23 VITALS — BP 110/66 | HR 90 | Ht 61.0 in | Wt 140.0 lb

## 2023-10-23 DIAGNOSIS — I10 Essential (primary) hypertension: Secondary | ICD-10-CM | POA: Diagnosis not present

## 2023-10-23 DIAGNOSIS — E785 Hyperlipidemia, unspecified: Secondary | ICD-10-CM

## 2023-10-23 DIAGNOSIS — E118 Type 2 diabetes mellitus with unspecified complications: Secondary | ICD-10-CM

## 2023-10-23 DIAGNOSIS — Z0000 Encounter for general adult medical examination without abnormal findings: Secondary | ICD-10-CM | POA: Diagnosis not present

## 2023-10-23 DIAGNOSIS — Z7984 Long term (current) use of oral hypoglycemic drugs: Secondary | ICD-10-CM

## 2023-10-23 DIAGNOSIS — E1169 Type 2 diabetes mellitus with other specified complication: Secondary | ICD-10-CM

## 2023-10-23 DIAGNOSIS — Z1231 Encounter for screening mammogram for malignant neoplasm of breast: Secondary | ICD-10-CM

## 2023-10-23 NOTE — Assessment & Plan Note (Signed)
 Controlled BP with normal exam. Current regimen is lisinopril. Will continue same medications; encourage continued reduced sodium diet.

## 2023-10-23 NOTE — Assessment & Plan Note (Signed)
LDL is  Lab Results  Component Value Date   LDLCALC 65 09/11/2022   Current regimen is Crestor.  Tolerating medications well without issues.

## 2023-10-23 NOTE — Assessment & Plan Note (Signed)
Blood sugars stable without hypoglycemic symptoms or events. Currently managed with rybelsus and metformin. Changes made last visit are none. Lab Results  Component Value Date   HGBA1C 6.5 (A) 08/30/2023

## 2023-10-23 NOTE — Progress Notes (Signed)
Date:  10/23/2023   Name:  Teresa Rivas   DOB:  [birth year]   MRN:  [id number]   Chief Complaint: Annual Exam Teresa Rivas is a 57 y.o. female who presents today for her Complete Annual Exam. She feels fairly well. She reports exercising/ walking. She reports she is sleeping fairly well. Breast complaints - none.  Mammogram: 10/2023 scheduled DEXA: none Colonoscopy: 12/2020 repeat 10 yrs Pap: d/c'd due to TAH  Health Maintenance Due  Topic Date Due   HIV Screening  Never done   DTaP/Tdap/Td (1 - Tdap) Never done   COVID-19 Vaccine (3 - 2023-24 season) 07/29/2023   Diabetic kidney evaluation - Urine ACR  09/12/2023   MAMMOGRAM  09/26/2023    Immunization History  Administered Date(s) Administered   Influenza, Seasonal, Injecte, Preservative Fre 08/30/2023   Influenza,inj,Quad PF,6+ Mos 09/02/2019, 09/09/2021, 09/11/2022   Influenza-Unspecified 09/02/2019   Moderna Sars-Covid-2 Vaccination 07/15/2020, 08/12/2020   Pneumococcal Conjugate-13 01/29/2014     Hypertension This is a chronic problem. The problem is controlled. Pertinent negatives include no chest pain, headaches, palpitations or shortness of breath. Past treatments include ACE inhibitors. There is no history of kidney disease, CAD/MI or CVA.  Diabetes She presents for her follow-up diabetic visit. She has type 2 diabetes mellitus. Her disease course has been stable. Pertinent negatives for hypoglycemia include no dizziness, headaches, nervousness/anxiousness or tremors. Pertinent negatives for diabetes include no chest pain, no fatigue, no polydipsia and no polyuria. Pertinent negatives for diabetic complications include no CVA. Current diabetic treatments: Rybelsus and metformin. An ACE inhibitor/angiotensin II receptor blocker is being taken.  Hyperlipidemia This is a chronic problem. The problem is controlled. Pertinent negatives include no chest pain or shortness of breath. Current  antihyperlipidemic treatment includes statins.    Review of Systems  Constitutional:  Negative for chills, fatigue and fever.  HENT:  Negative for congestion, hearing loss, tinnitus, trouble swallowing and voice change.   Eyes:  Negative for visual disturbance.  Respiratory:  Negative for cough, chest tightness, shortness of breath and wheezing.   Cardiovascular:  Negative for chest pain, palpitations and leg swelling.  Gastrointestinal:  Negative for abdominal pain, constipation, diarrhea and vomiting.  Endocrine: Negative for polydipsia and polyuria.  Genitourinary:  Negative for dysuria, frequency, genital sores, vaginal bleeding and vaginal discharge.  Musculoskeletal:  Negative for arthralgias, gait problem and joint swelling.  Skin:  Negative for color change and rash.  Neurological:  Negative for dizziness, tremors, light-headedness and headaches.  Hematological:  Negative for adenopathy. Does not bruise/bleed easily.  Psychiatric/Behavioral:  Negative for dysphoric mood and sleep disturbance. The patient is not nervous/anxious.      Lab Results  Component Value Date   NA 140 04/10/2023   K 3.8 04/10/2023   CO2 23 04/10/2023   GLUCOSE 118 (H) 04/10/2023   BUN 18 04/10/2023   CREATININE 0.91 04/10/2023   CALCIUM 9.7 04/10/2023   EGFR 86 09/11/2022   GFRNONAA >60 04/10/2023   Lab Results  Component Value Date   CHOL 143 09/11/2022   HDL 67 09/11/2022   LDLCALC 65 09/11/2022   TRIG 47 09/11/2022   CHOLHDL 2.0 09/09/2021   Lab Results  Component Value Date   TSH 1.63 09/11/2022   Lab Results  Component Value Date   HGBA1C 6.5 (A) 08/30/2023   Lab Results  Component Value Date   WBC 9.0 04/10/2023   HGB 11.6 (L) 04/10/2023   HCT 37.9 04/10/2023   MCV 84.6 04/10/2023  PLT 228 04/10/2023   Lab Results  Component Value Date   ALT 17 04/10/2023   AST 26 04/10/2023   ALKPHOS 55 04/10/2023   BILITOT 0.7 04/10/2023   Lab Results  Component Value Date    VD25OH 22.49 (L) 09/06/2020     Patient Active Problem List   Diagnosis Date Noted   Abdominal pain 08/30/2023   Hematuria 09/09/2021   S/P total hysterectomy and bilateral salpingo-oophorectomy 09/05/2020   Epicondylitis, lateral, right 09/26/2017   Type II diabetes mellitus with complication (HCC) 05/22/2017   Hyperlipidemia associated with type 2 diabetes mellitus (HCC) 05/22/2017   Essential hypertension 05/22/2017   Renal stones 05/22/2017    Allergies  Allergen Reactions   Codeine     Past Surgical History:  Procedure Laterality Date   ABDOMINAL HYSTERECTOMY  07/28/2016   total due to fibroids   COLONOSCOPY WITH PROPOFOL N/A 01/21/2021   Procedure: COLONOSCOPY WITH PROPOFOL;  Surgeon: Regis Bill, MD;  Location: [facility] ENDOSCOPY;  Service: Endoscopy;  Laterality: N/A;   CYSTOSCOPY/URETEROSCOPY/HOLMIUM LASER/STENT PLACEMENT Right 04/27/2023   Procedure: CYSTOSCOPY/URETEROSCOPY/HOLMIUM LASER/STENT PLACEMENT;  Surgeon: Sondra Come, MD;  Location: [facility] ORS;  Service: Urology;  Laterality: Right;   ESOPHAGOGASTRODUODENOSCOPY (EGD) WITH PROPOFOL N/A 08/24/2023   Procedure: ESOPHAGOGASTRODUODENOSCOPY (EGD) WITH PROPOFOL;  Surgeon: Regis Bill, MD;  Location: [facility] ENDOSCOPY;  Service: Endoscopy;  Laterality: N/A;   TONSILLECTOMY      Social History   Tobacco Use   Smoking status: Never   Smokeless tobacco: Never  Vaping Use   Vaping status: Never Used  Substance Use Topics   Alcohol use: Not Currently    Alcohol/week: 1.0 standard drink of alcohol    Types: 1 Standard drinks or equivalent per week    Comment: occassionally   Drug use: No     Medication list has been reviewed and updated.  Current Meds  Medication Sig   glucose blood test strip 1 each by Other route as needed for other. Use as instructed   ipratropium (ATROVENT) 0.06 % nasal spray Place 2 sprays into both nostrils 4 (four) times daily. (Patient taking differently: Place 2 sprays into  both nostrils as needed.)   lisinopril (ZESTRIL) 10 MG tablet Take 1 tablet (10 mg total) by mouth daily.   metFORMIN (GLUCOPHAGE) 1000 MG tablet Take 1 tablet (1,000 mg total) by mouth 2 (two) times daily with a meal.   methocarbamol (ROBAXIN) 500 MG tablet Take 1 tablet (500 mg total) by mouth at bedtime. (Patient taking differently: Take 500 mg by mouth as needed.)   rosuvastatin (CRESTOR) 10 MG tablet Take 1 tablet (10 mg total) by mouth daily.   Semaglutide (RYBELSUS) 3 MG TABS Take 1 tablet (3 mg total) by mouth daily.       10/23/2023    9:00 AM 08/30/2023   11:01 AM 04/17/2023    9:05 AM 10/17/2022    2:43 PM  GAD 7 : Generalized Anxiety Score  Nervous, Anxious, on Edge 1 0 0 0  Control/stop worrying 1 0 0 0  Worry too much - different things 1 0 0 0  Trouble relaxing 0 0 0 0  Restless 0 0 0 0  Easily annoyed or irritable 0 0 0 0  Afraid - awful might happen 0 0 0 0  Total GAD 7 Score 3 0 0 0  Anxiety Difficulty Not difficult at all Not difficult at all Not difficult at all Not difficult at all  10/23/2023    8:59 AM 08/30/2023   11:01 AM 04/17/2023    9:05 AM  Depression screen PHQ 2/9  Decreased Interest 3 0 0  Down, Depressed, Hopeless 3 0 0  PHQ - 2 Score 6 0 0  Altered sleeping 0 0 0  Tired, decreased energy 0 1 3  Change in appetite 0 0 3  Feeling bad or failure about yourself  0 0 0  Trouble concentrating 0 0 0  Moving slowly or fidgety/restless 0 0 0  Suicidal thoughts 0 0 0  PHQ-9 Score 6 1 6   Difficult doing work/chores Not difficult at all Not difficult at all Not difficult at all    BP Readings from Last 3 Encounters:  10/23/23 110/66  08/30/23 136/80  08/24/23 106/62    Physical Exam Vitals and nursing note reviewed.  Constitutional:      General: She is not in acute distress.    Appearance: She is well-developed.  HENT:     Head: Normocephalic and atraumatic.     Right Ear: Tympanic membrane and ear canal normal.     Left Ear:  Tympanic membrane and ear canal normal.     Nose:     Right Sinus: No maxillary sinus tenderness.     Left Sinus: No maxillary sinus tenderness.  Eyes:     General: No scleral icterus.       Right eye: No discharge.        Left eye: No discharge.     Conjunctiva/sclera: Conjunctivae normal.  Neck:     Thyroid: No thyromegaly.     Vascular: No carotid bruit.  Cardiovascular:     Rate and Rhythm: Normal rate and regular rhythm.     Pulses: Normal pulses.     Heart sounds: Normal heart sounds.  Pulmonary:     Effort: Pulmonary effort is normal. No respiratory distress.     Breath sounds: No wheezing.  Abdominal:     General: Bowel sounds are normal.     Palpations: Abdomen is soft.     Tenderness: There is no abdominal tenderness.  Musculoskeletal:     Cervical back: Normal range of motion. No erythema.     Right lower leg: No edema.     Left lower leg: No edema.  Lymphadenopathy:     Cervical: No cervical adenopathy.  Skin:    General: Skin is warm and dry.     Findings: No rash.  Neurological:     Mental Status: She is alert and oriented to person, place, and time.     Cranial Nerves: No cranial nerve deficit.     Sensory: No sensory deficit.     Deep Tendon Reflexes: Reflexes are normal and symmetric.  Psychiatric:        Attention and Perception: Attention normal.        Mood and Affect: Mood normal.    Diabetic Foot Exam - Simple   Simple Foot Form Diabetic Foot exam was performed with the following findings: Yes 10/23/2023  9:13 AM  Visual Inspection No deformities, no ulcerations, no other skin breakdown bilaterally: Yes Sensation Testing Intact to touch and monofilament testing bilaterally: Yes Pulse Check Posterior Tibialis and Dorsalis pulse intact bilaterally: Yes Comments      Wt Readings from Last 3 Encounters:  10/23/23 140 lb (63.5 kg)  08/30/23 137 lb (62.1 kg)  08/24/23 139 lb (63 kg)    BP 110/66   Pulse 90   Ht 5\' 1"  (1.549  m)   Wt 140  lb (63.5 kg)   LMP  (LMP Unknown)   SpO2 98%   BMI 26.45 kg/m   Assessment and Plan:  Problem List Items Addressed This Visit       Unprioritized   Essential hypertension (Chronic)    Controlled BP with normal exam. Current regimen is lisinopril. Will continue same medications; encourage continued reduced sodium diet.       Relevant Orders   CBC with Differential/Platelet   Comprehensive metabolic panel   TSH   Hyperlipidemia associated with type 2 diabetes mellitus (HCC) (Chronic)    LDL is  Lab Results  Component Value Date   LDLCALC 65 09/11/2022   Current regimen is Crestor.  Tolerating medications well without issues.       Relevant Orders   Lipid panel   Type II diabetes mellitus with complication (HCC) (Chronic)    Blood sugars stable without hypoglycemic symptoms or events. Currently managed with rybelsus and metformin. Changes made last visit are none. Lab Results  Component Value Date   HGBA1C 6.5 (A) 08/30/2023         Relevant Orders   Comprehensive metabolic panel   Microalbumin / creatinine urine ratio   Other Visit Diagnoses     Annual physical exam    -  Primary   consider Prev-20 next visit she declines Shingrix vaccine   Relevant Orders   CBC with Differential/Platelet   Comprehensive metabolic panel   Lipid panel   Microalbumin / creatinine urine ratio   TSH   Encounter for screening mammogram for breast cancer       scheduled for next month   Long term current use of oral hypoglycemic drug           Return in about 4 months (around 02/20/2024) for DM, HTN.    Reubin Milan, MD [facility] [place]

## 2023-10-28 ENCOUNTER — Other Ambulatory Visit: Payer: Self-pay | Admitting: Internal Medicine

## 2023-10-28 DIAGNOSIS — E782 Mixed hyperlipidemia: Secondary | ICD-10-CM

## 2023-10-31 NOTE — Telephone Encounter (Signed)
Requested medication (s) are due for refill today - yes  Requested medication (s) are on the active medication list -yes  Future visit scheduled -yes  Last refill: 04/30/23 #90 1RF  Notes to clinic: fails lab protocol- over 1 year -09/11/22  Requested Prescriptions  Pending Prescriptions Disp Refills   rosuvastatin (CRESTOR) 10 MG tablet [Pharmacy Med Name: Rosuvastatin Calcium 10 MG Oral Tablet] 90 tablet 0    Sig: Take 1 tablet by mouth once daily     Cardiovascular:  Antilipid - Statins 2 Failed - 10/28/2023  1:13 PM      Failed - Lipid Panel in normal range within the last 12 months    Cholesterol, Total  Date Value Ref Range Status  09/09/2021 148 100 - 199 mg/dL Final   Cholesterol  Date Value Ref Range Status  09/11/2022 143 0 - 200 Final   LDL Chol Calc (NIH)  Date Value Ref Range Status  09/09/2021 64 0 - 99 mg/dL Final   LDL Cholesterol  Date Value Ref Range Status  09/11/2022 65  Final   HDL  Date Value Ref Range Status  09/11/2022 67 35 - 70 Final  09/09/2021 74 >39 mg/dL Final   Triglycerides  Date Value Ref Range Status  09/11/2022 47 40 - 160 Final         Passed - Cr in normal range and within 360 days    Creatinine, Ser  Date Value Ref Range Status  04/10/2023 0.91 0.44 - 1.00 mg/dL Final         Passed - Patient is not pregnant      Passed - Valid encounter within last 12 months    Recent Outpatient Visits           1 week ago Annual physical exam   [place] [facility] at [facility], Nyoka Cowden, MD   2 months ago Type II diabetes mellitus with complication [facility])   [place] [facility] at [facility], Nyoka Cowden, MD   6 months ago Essential hypertension   [place] [facility] at [facility], Nyoka Cowden, MD   6 months ago Acute non-recurrent maxillary sinusitis   [place] [facility] at [facility], Nyoka Cowden, MD   1 year ago Essential hypertension   [place] [facility] at [facility], Nyoka Cowden, MD       Future Appointments             In 3 months Reubin Milan, MD [facility] at [facility]   In 5 months Carman Ching, [facility] [place]               Requested Prescriptions  Pending Prescriptions Disp Refills   rosuvastatin (CRESTOR) 10 MG tablet [Pharmacy Med Name: Rosuvastatin Calcium 10 MG Oral Tablet] 90 tablet 0    Sig: Take 1 tablet by mouth once daily     Cardiovascular:  Antilipid - Statins 2 Failed - 10/28/2023  1:13 PM      Failed - Lipid Panel in normal range within the last 12 months    Cholesterol, Total  Date Value Ref Range Status  09/09/2021 148 100 - 199 mg/dL Final   Cholesterol  Date Value Ref Range Status  09/11/2022 143 0 - 200 Final   LDL Chol Calc (NIH)  Date Value Ref Range Status  09/09/2021 64 0 - 99 mg/dL Final   LDL Cholesterol  Date Value Ref Range Status  09/11/2022 65  Final   HDL  Date Value Ref Range Status  09/11/2022 67 35 - 70 Final  09/09/2021 74 >39 mg/dL Final   Triglycerides  Date Value Ref Range Status  09/11/2022 47 40 - 160 Final         Passed - Cr in normal range and within 360 days    Creatinine, Ser  Date Value Ref Range Status  04/10/2023 0.91 0.44 - 1.00 mg/dL Final         Passed - Patient is not pregnant      Passed - Valid encounter within last 12 months    Recent Outpatient Visits           1 week ago Annual physical exam   [place] [facility] at [facility], Nyoka Cowden, MD   2 months ago Type II diabetes mellitus with complication [facility])   [place] [facility] at [facility], Nyoka Cowden, MD   6 months ago Essential hypertension   [place] [facility] at [facility], Nyoka Cowden, MD   6 months  ago Acute non-recurrent maxillary sinusitis   [place] [facility] at [facility], Nyoka Cowden, MD   1 year ago Essential hypertension   [place] [facility] at [facility], Nyoka Cowden, MD       Future Appointments             In 3 months Judithann Graves, Nyoka Cowden, MD [facility] at [facility], [facility]   In 5 months Carman Ching, [place] [facility] [place]

## 2023-11-09 ENCOUNTER — Encounter: Payer: Self-pay | Admitting: Internal Medicine

## 2023-11-09 DIAGNOSIS — I10 Essential (primary) hypertension: Secondary | ICD-10-CM | POA: Diagnosis not present

## 2023-11-09 DIAGNOSIS — E785 Hyperlipidemia, unspecified: Secondary | ICD-10-CM | POA: Diagnosis not present

## 2023-11-09 DIAGNOSIS — E1169 Type 2 diabetes mellitus with other specified complication: Secondary | ICD-10-CM | POA: Diagnosis not present

## 2023-11-09 DIAGNOSIS — E118 Type 2 diabetes mellitus with unspecified complications: Secondary | ICD-10-CM | POA: Diagnosis not present

## 2023-11-09 DIAGNOSIS — Z0000 Encounter for general adult medical examination without abnormal findings: Secondary | ICD-10-CM | POA: Diagnosis not present

## 2023-11-11 ENCOUNTER — Encounter: Payer: Self-pay | Admitting: Internal Medicine

## 2023-11-11 LAB — COMPREHENSIVE METABOLIC PANEL
ALT: 17 [IU]/L (ref 0–32)
AST: 24 [IU]/L (ref 0–40)
Albumin: 4.5 g/dL (ref 3.8–4.9)
Alkaline Phosphatase: 77 [IU]/L (ref 44–121)
BUN/Creatinine Ratio: 15 (ref 9–23)
BUN: 13 mg/dL (ref 6–24)
Bilirubin Total: 0.3 mg/dL (ref 0.0–1.2)
CO2: 23 mmol/L (ref 20–29)
Calcium: 9.9 mg/dL (ref 8.7–10.2)
Chloride: 101 mmol/L (ref 96–106)
Creatinine, Ser: 0.87 mg/dL (ref 0.57–1.00)
Globulin, Total: 2.3 g/dL (ref 1.5–4.5)
Glucose: 173 mg/dL — ABNORMAL HIGH (ref 70–99)
Potassium: 4 mmol/L (ref 3.5–5.2)
Sodium: 139 mmol/L (ref 134–144)
Total Protein: 6.8 g/dL (ref 6.0–8.5)
eGFR: 78 mL/min/{1.73_m2} (ref >59)

## 2023-11-11 LAB — CBC WITH DIFFERENTIAL/PLATELET
Basophils Absolute: 0 10*3/uL (ref 0.0–0.2)
Basos: 1 %
EOS (ABSOLUTE): 0.2 10*3/uL (ref 0.0–0.4)
Eos: 3 %
Hematocrit: 38.3 % (ref 34.0–46.6)
Hemoglobin: 11.7 g/dL (ref 11.1–15.9)
Immature Grans (Abs): 0 10*3/uL (ref 0.0–0.1)
Immature Granulocytes: 0 %
Lymphocytes Absolute: 2.2 10*3/uL (ref 0.7–3.1)
Lymphs: 37 %
MCH: 25.7 pg — ABNORMAL LOW (ref 26.6–33.0)
MCHC: 30.5 g/dL — ABNORMAL LOW (ref 31.5–35.7)
MCV: 84 fL (ref 79–97)
Monocytes Absolute: 0.4 10*3/uL (ref 0.1–0.9)
Monocytes: 6 %
Neutrophils Absolute: 3.1 10*3/uL (ref 1.4–7.0)
Neutrophils: 53 %
Platelets: 212 10*3/uL (ref 150–450)
RBC: 4.55 x10E6/uL (ref 3.77–5.28)
RDW: 12.9 % (ref 11.7–15.4)
WBC: 6 10*3/uL (ref 3.4–10.8)

## 2023-11-11 LAB — MICROALBUMIN / CREATININE URINE RATIO
Creatinine, Urine: 87.6 mg/dL
Microalb/Creat Ratio: 8 mg/g{creat} (ref 0–29)
Microalbumin, Urine: 7.2 ug/mL

## 2023-11-11 LAB — TSH: TSH: 1.48 u[IU]/mL (ref 0.450–4.500)

## 2023-11-11 LAB — LIPID PANEL
Chol/HDL Ratio: 2.2 {ratio} (ref 0.0–4.4)
Cholesterol, Total: 167 mg/dL (ref 100–199)
HDL: 76 mg/dL (ref >39)
LDL Chol Calc (NIH): 81 mg/dL (ref 0–99)
Triglycerides: 45 mg/dL (ref 0–149)
VLDL Cholesterol Cal: 10 mg/dL (ref 5–40)

## 2023-11-15 ENCOUNTER — Ambulatory Visit: Payer: BC Managed Care – PPO

## 2023-12-06 ENCOUNTER — Ambulatory Visit
Admission: RE | Admit: 2023-12-06 | Discharge: 2023-12-06 | Disposition: A | Discharge status: Home / Self Care | Payer: BC Managed Care – PPO | Source: Ambulatory Visit | Attending: Internal Medicine | Admitting: Internal Medicine

## 2023-12-06 DIAGNOSIS — Z1231 Encounter for screening mammogram for malignant neoplasm of breast: Secondary | ICD-10-CM | POA: Insufficient documentation

## 2023-12-10 DIAGNOSIS — K219 Gastro-esophageal reflux disease without esophagitis: Secondary | ICD-10-CM | POA: Diagnosis not present

## 2024-02-15 ENCOUNTER — Other Ambulatory Visit: Payer: Self-pay | Admitting: Internal Medicine

## 2024-02-15 DIAGNOSIS — E782 Mixed hyperlipidemia: Secondary | ICD-10-CM

## 2024-02-15 NOTE — Telephone Encounter (Signed)
 Requested Prescriptions  Pending Prescriptions Disp Refills   rosuvastatin (CRESTOR) 10 MG tablet [Pharmacy Med Name: ROSUVASTATIN 10MG  TAB] 90 tablet 0    Sig: Take 1 tablet by mouth once daily     Cardiovascular:  Antilipid - Statins 2 Failed - 02/15/2024  5:48 PM      Failed - Lipid Panel in normal range within the last 12 months    Cholesterol, Total  Date Value Ref Range Status  11/09/2023 167 100 - 199 mg/dL Final   LDL Chol Calc (NIH)  Date Value Ref Range Status  11/09/2023 81 0 - 99 mg/dL Final   HDL  Date Value Ref Range Status  11/09/2023 76 >39 mg/dL Final   Triglycerides  Date Value Ref Range Status  11/09/2023 45 0 - 149 mg/dL Final         Passed - Cr in normal range and within 360 days    Creatinine, Ser  Date Value Ref Range Status  11/09/2023 0.87 0.57 - 1.00 mg/dL Final         Passed - Patient is not pregnant      Passed - Valid encounter within last 12 months    Recent Outpatient Visits           3 months ago Annual physical exam   [place] [facility] at [facility], Nyoka Cowden, MD   5 months ago Type II diabetes mellitus with complication [facility])   [place] [facility] at [facility], Nyoka Cowden, MD   9 months ago Essential hypertension   [place] [facility] at [facility], Nyoka Cowden, MD   10 months ago Acute non-recurrent maxillary sinusitis   [place] [facility] at [facility], Nyoka Cowden, MD   1 year ago Essential hypertension   [place] [facility] at [facility], Nyoka Cowden, MD       Future Appointments             In 5 days Judithann Graves, Nyoka Cowden, MD [facility] at [facility]   In 2 months Carman Ching, [facility] [place]

## 2024-02-20 ENCOUNTER — Ambulatory Visit: Payer: BC Managed Care – PPO | Admitting: Internal Medicine

## 2024-02-26 ENCOUNTER — Encounter: Payer: BC Managed Care – PPO | Admitting: Internal Medicine

## 2024-03-14 ENCOUNTER — Other Ambulatory Visit: Payer: Self-pay | Admitting: Internal Medicine

## 2024-03-14 DIAGNOSIS — I10 Essential (primary) hypertension: Secondary | ICD-10-CM

## 2024-03-14 NOTE — Telephone Encounter (Signed)
 Med refill

## 2024-04-15 ENCOUNTER — Other Ambulatory Visit: Payer: Self-pay | Admitting: Internal Medicine

## 2024-04-15 DIAGNOSIS — E118 Type 2 diabetes mellitus with unspecified complications: Secondary | ICD-10-CM

## 2024-04-17 NOTE — Telephone Encounter (Signed)
 Requested Prescriptions  Pending Prescriptions Disp Refills   metFORMIN  (GLUCOPHAGE ) 1000 MG tablet [Pharmacy Med Name: metFORMIN  HCl 1000 MG Oral Tablet] 180 tablet 0    Sig: TAKE 1 TABLET BY MOUTH TWICE DAILY WITH  A  MEAL     Endocrinology:  Diabetes - Biguanides Failed - 04/17/2024  8:45 AM      Failed - HBA1C is between 0 and 7.9 and within 180 days    Hemoglobin A1C  Date Value Ref Range Status  08/30/2023 6.5 (A) 4.0 - 5.6 % Final  09/11/2022 6.9  Final         Failed - B12 Level in normal range and within 720 days    No results found for: "VITAMINB12"       Failed - Valid encounter within last 6 months    Recent Outpatient Visits   None     Future Appointments             In 1 week Kathreen Pare, PA-C [facility] [place]            Passed - Cr in normal range and within 360 days    Creatinine, Ser  Date Value Ref Range Status  11/09/2023 0.87 0.57 - 1.00 mg/dL Final         Passed - eGFR in normal range and within 360 days    GFR calc Af Amer  Date Value Ref Range Status  05/07/2020 108 >59 mL/min/1.73 Final    Comment:    **[facility] currently reports eGFR in compliance with the current**   recommendations of the [employer]. [facility] will   update reporting as new guidelines are published from the NKF-ASN   Task force.    GFR, Estimated  Date Value Ref Range Status  04/10/2023 >60 >60 mL/min Final    Comment:    (NOTE) Calculated using the CKD-EPI Creatinine Equation (2021)    eGFR  Date Value Ref Range Status  11/09/2023 78 >59 mL/min/1.73 Final         Passed - CBC within normal limits and completed in the last 12 months    WBC  Date Value Ref Range Status  11/09/2023 6.0 3.4 - 10.8 x10E3/uL Final  04/10/2023 9.0 4.0 - 10.5 K/uL Final   RBC  Date Value Ref Range Status  11/09/2023 4.55 3.77 - 5.28 x10E6/uL Final  04/10/2023 4.48 3.87 - 5.11 MIL/uL Final   Hemoglobin  Date Value Ref Range Status   11/09/2023 11.7 11.1 - 15.9 g/dL Final   Hematocrit  Date Value Ref Range Status  11/09/2023 38.3 34.0 - 46.6 % Final   MCHC  Date Value Ref Range Status  11/09/2023 30.5 (L) 31.5 - 35.7 g/dL Final  78/46/9629 52.8 30.0 - 36.0 g/dL Final   [facility]  Date Value Ref Range Status  11/09/2023 25.7 (L) 26.6 - 33.0 pg Final  04/10/2023 25.9 (L) 26.0 - 34.0 pg Final   MCV  Date Value Ref Range Status  11/09/2023 84 79 - 97 fL Final   No results found for: "PLTCOUNTKUC", "LABPLAT", "POCPLA" RDW  Date Value Ref Range Status  11/09/2023 12.9 11.7 - 15.4 % Final

## 2024-04-25 ENCOUNTER — Other Ambulatory Visit: Payer: Self-pay | Admitting: Physician Assistant

## 2024-04-25 ENCOUNTER — Ambulatory Visit
Admission: RE | Admit: 2024-04-25 | Discharge: 2024-04-25 | Disposition: A | Discharge status: Home / Self Care | Source: Ambulatory Visit | Attending: Physician Assistant | Admitting: Physician Assistant

## 2024-04-25 ENCOUNTER — Encounter: Payer: Self-pay | Admitting: Internal Medicine

## 2024-04-25 ENCOUNTER — Ambulatory Visit: Admitting: Internal Medicine

## 2024-04-25 ENCOUNTER — Ambulatory Visit: Payer: Self-pay | Admitting: Physician Assistant

## 2024-04-25 VITALS — BP 112/66 | HR 84 | Ht 61.0 in | Wt 140.2 lb

## 2024-04-25 VITALS — BP 127/76 | HR 77 | Ht 61.0 in | Wt 139.8 lb

## 2024-04-25 DIAGNOSIS — E1169 Type 2 diabetes mellitus with other specified complication: Secondary | ICD-10-CM | POA: Diagnosis not present

## 2024-04-25 DIAGNOSIS — N200 Calculus of kidney: Secondary | ICD-10-CM | POA: Insufficient documentation

## 2024-04-25 DIAGNOSIS — I10 Essential (primary) hypertension: Secondary | ICD-10-CM

## 2024-04-25 DIAGNOSIS — I878 Other specified disorders of veins: Secondary | ICD-10-CM | POA: Diagnosis not present

## 2024-04-25 DIAGNOSIS — E118 Type 2 diabetes mellitus with unspecified complications: Secondary | ICD-10-CM | POA: Diagnosis not present

## 2024-04-25 DIAGNOSIS — K5900 Constipation, unspecified: Secondary | ICD-10-CM | POA: Diagnosis not present

## 2024-04-25 DIAGNOSIS — Z87442 Personal history of urinary calculi: Secondary | ICD-10-CM | POA: Diagnosis not present

## 2024-04-25 DIAGNOSIS — Z7984 Long term (current) use of oral hypoglycemic drugs: Secondary | ICD-10-CM | POA: Diagnosis not present

## 2024-04-25 DIAGNOSIS — E785 Hyperlipidemia, unspecified: Secondary | ICD-10-CM

## 2024-04-25 LAB — POCT GLYCOSYLATED HEMOGLOBIN (HGB A1C): Hemoglobin A1C: 6 % — AB (ref 4.0–5.6)

## 2024-04-25 MED ORDER — ROSUVASTATIN CALCIUM 10 MG PO TABS: 10.0000 mg | ORAL_TABLET | Freq: Every day | ORAL | 1 refills | Status: DC

## 2024-04-25 MED ORDER — METFORMIN HCL 1000 MG PO TABS: 1000.0000 mg | ORAL_TABLET | Freq: Two times a day (BID) | ORAL | 1 refills | Status: AC

## 2024-04-25 MED ORDER — RYBELSUS 3 MG PO TABS: 1.0000 | ORAL_TABLET | Freq: Every day | ORAL | 5 refills | Status: DC

## 2024-04-25 MED ORDER — LISINOPRIL 10 MG PO TABS: 10.0000 mg | ORAL_TABLET | Freq: Every day | ORAL | 1 refills | Status: AC

## 2024-04-25 NOTE — Assessment & Plan Note (Signed)
 LDL is  Lab Results  Component Value Date   LDLCALC 81 11/09/2023   Current regimen is atorvastatin.  No medication side effects noted. Goal LDL is <70.

## 2024-04-25 NOTE — Assessment & Plan Note (Signed)
 Blood sugars have been stable.  No recent hypoglycemic events requiring assistance. Currently medications are Rybelsus  and MTF. Lab Results  Component Value Date   HGBA1C 6.0 (A) 04/25/2024   Last visit no changes were made.  Will continue the same medications.

## 2024-04-25 NOTE — Assessment & Plan Note (Signed)
 Blood pressure is well controlled.  Current medications lisinopril. Will continue same regimen along with efforts to limit dietary sodium.

## 2024-04-25 NOTE — Progress Notes (Signed)
 Date:  04/25/2024   Name:  Teresa Rivas   DOB:  03/30/1966   MRN:  [id number]   Chief Complaint: Diabetes (Patient presents today for a follow up in her diabetes. She has been taking her medications as directed and has no concerns for today's visit.)  Diabetes She presents for her follow-up diabetic visit. She has type 2 diabetes mellitus. Her disease course has been stable. Pertinent negatives for hypoglycemia include no headaches, nervousness/anxiousness or tremors. Pertinent negatives for diabetes include no chest pain, no fatigue, no polydipsia and no polyuria. Pertinent negatives for diabetic complications include no CVA.  Hypertension This is a chronic problem. The problem is controlled. Pertinent negatives include no chest pain, headaches, palpitations or shortness of breath. Past treatments include ACE inhibitors. There is no history of kidney disease, CAD/MI or CVA.  Hyperlipidemia This is a chronic problem. The problem is controlled. Pertinent negatives include no chest pain or shortness of breath. Current antihyperlipidemic treatment includes statins.    Review of Systems  Constitutional:  Negative for appetite change, fatigue, fever and unexpected weight change.  HENT:  Negative for tinnitus and trouble swallowing.   Eyes:  Negative for visual disturbance.  Respiratory:  Negative for cough, chest tightness and shortness of breath.   Cardiovascular:  Negative for chest pain, palpitations and leg swelling.  Gastrointestinal:  Negative for abdominal pain.  Endocrine: Negative for polydipsia and polyuria.  Genitourinary:  Negative for dysuria and hematuria.  Musculoskeletal:  Negative for arthralgias.  Skin:  Negative for color change and wound.  Neurological:  Negative for tremors, numbness and headaches.  Psychiatric/Behavioral:  Negative for dysphoric mood and sleep disturbance. The patient is not nervous/anxious.      Lab Results  Component Value Date   NA  139 11/09/2023   K 4.0 11/09/2023   CO2 23 11/09/2023   GLUCOSE 173 (H) 11/09/2023   BUN 13 11/09/2023   CREATININE 0.87 11/09/2023   CALCIUM  9.9 11/09/2023   EGFR 78 11/09/2023   GFRNONAA >60 04/10/2023   Lab Results  Component Value Date   CHOL 167 11/09/2023   HDL 76 11/09/2023   LDLCALC 81 11/09/2023   TRIG 45 11/09/2023   CHOLHDL 2.2 11/09/2023   Lab Results  Component Value Date   TSH 1.480 11/09/2023   Lab Results  Component Value Date   HGBA1C 6.0 (A) 04/25/2024   Lab Results  Component Value Date   WBC 6.0 11/09/2023   HGB 11.7 11/09/2023   HCT 38.3 11/09/2023   MCV 84 11/09/2023   PLT 212 11/09/2023   Lab Results  Component Value Date   ALT 17 11/09/2023   AST 24 11/09/2023   ALKPHOS 77 11/09/2023   BILITOT 0.3 11/09/2023   Lab Results  Component Value Date   VD25OH 22.49 (L) 09/06/2020     Patient Active Problem List   Diagnosis Date Noted   Abdominal pain 08/30/2023   Hematuria 09/09/2021   S/P total hysterectomy and bilateral salpingo-oophorectomy 09/05/2020   Epicondylitis, lateral, right 09/26/2017   Type II diabetes mellitus with complication (HCC) 05/22/2017   Hyperlipidemia associated with type 2 diabetes mellitus (HCC) 05/22/2017   Essential hypertension 05/22/2017   Renal stones 05/22/2017    Allergies  Allergen Reactions   Codeine     Past Surgical History:  Procedure Laterality Date   ABDOMINAL HYSTERECTOMY  07/28/2016   total due to fibroids   COLONOSCOPY WITH PROPOFOL  N/A 01/21/2021   Procedure: COLONOSCOPY WITH PROPOFOL ;  Surgeon:  Shane Darling, MD;  Location: [facility];  Service: Endoscopy;  Laterality: N/A;   CYSTOSCOPY/URETEROSCOPY/HOLMIUM LASER/STENT PLACEMENT Right 04/27/2023   Procedure: CYSTOSCOPY/URETEROSCOPY/HOLMIUM LASER/STENT PLACEMENT;  Surgeon: Lawerence Pressman, MD;  Location: [facility];  Service: Urology;  Laterality: Right;   ESOPHAGOGASTRODUODENOSCOPY (EGD) WITH PROPOFOL  N/A 08/24/2023   Procedure:  ESOPHAGOGASTRODUODENOSCOPY (EGD) WITH PROPOFOL ;  Surgeon: Shane Darling, MD;  Location: [facility];  Service: Endoscopy;  Laterality: N/A;   TONSILLECTOMY      Social History   Tobacco Use   Smoking status: Never   Smokeless tobacco: Never  Vaping Use   Vaping status: Never Used  Substance Use Topics   Alcohol use: Not Currently    Alcohol/week: 1.0 standard drink of alcohol    Types: 1 Standard drinks or equivalent per week    Comment: occassionally   Drug use: No     Medication list has been reviewed and updated.  Current Meds  Medication Sig   glucose blood test strip 1 each by Other route as needed for other. Use as instructed   ipratropium (ATROVENT ) 0.06 % nasal spray Place 2 sprays into both nostrils 4 (four) times daily. (Patient taking differently: Place 2 sprays into both nostrils as needed.)   methocarbamol  (ROBAXIN ) 500 MG tablet Take 1 tablet (500 mg total) by mouth at bedtime. (Patient taking differently: Take 500 mg by mouth as needed.)   [DISCONTINUED] lisinopril  (ZESTRIL ) 10 MG tablet Take 1 tablet by mouth once daily   [DISCONTINUED] metFORMIN  (GLUCOPHAGE ) 1000 MG tablet TAKE 1 TABLET BY MOUTH TWICE DAILY WITH  A  MEAL   [DISCONTINUED] rosuvastatin  (CRESTOR ) 10 MG tablet Take 1 tablet by mouth once daily   [DISCONTINUED] Semaglutide  (RYBELSUS ) 3 MG TABS Take 1 tablet (3 mg total) by mouth daily.       10/23/2023    9:00 AM 08/30/2023   11:01 AM 04/17/2023    9:05 AM 10/17/2022    2:43 PM  GAD 7 : Generalized Anxiety Score  Nervous, Anxious, on Edge 1 0 0 0  Control/stop worrying 1 0 0 0  Worry too much - different things 1 0 0 0  Trouble relaxing 0 0 0 0  Restless 0 0 0 0  Easily annoyed or irritable 0 0 0 0  Afraid - awful might happen 0 0 0 0  Total GAD 7 Score 3 0 0 0  Anxiety Difficulty Not difficult at all Not difficult at all Not difficult at all Not difficult at all       10/23/2023    8:59 AM 08/30/2023   11:01 AM 04/17/2023     9:05 AM  Depression screen PHQ 2/9  Decreased Interest 3 0 0  Down, Depressed, Hopeless 3 0 0  PHQ - 2 Score 6 0 0  Altered sleeping 0 0 0  Tired, decreased energy 0 1 3  Change in appetite 0 0 3  Feeling bad or failure about yourself  0 0 0  Trouble concentrating 0 0 0  Moving slowly or fidgety/restless 0 0 0  Suicidal thoughts 0 0 0  PHQ-9 Score 6 1 6   Difficult doing work/chores Not difficult at all Not difficult at all Not difficult at all    BP Readings from Last 3 Encounters:  04/25/24 112/66  04/25/24 127/76  10/23/23 110/66    Physical Exam Vitals and nursing note reviewed.  Constitutional:      General: She is not in acute distress.    Appearance: Normal appearance.  She is well-developed.  HENT:     Head: Normocephalic and atraumatic.  Neck:     Vascular: No carotid bruit.  Cardiovascular:     Rate and Rhythm: Normal rate and regular rhythm.     Heart sounds: No murmur heard. Pulmonary:     Effort: Pulmonary effort is normal. No respiratory distress.     Breath sounds: No wheezing or rhonchi.  Musculoskeletal:     Cervical back: Normal range of motion.     Right lower leg: No edema.     Left lower leg: No edema.  Lymphadenopathy:     Cervical: No cervical adenopathy.  Skin:    General: Skin is warm and dry.     Findings: No rash.  Neurological:     General: No focal deficit present.     Mental Status: She is alert and oriented to person, place, and time.  Psychiatric:        Mood and Affect: Mood normal.        Behavior: Behavior normal.     Wt Readings from Last 3 Encounters:  04/25/24 140 lb 3.2 oz (63.6 kg)  04/25/24 139 lb 12.8 oz (63.4 kg)  10/23/23 140 lb (63.5 kg)    BP 112/66   Pulse 84   Ht 5\' 1"  (1.549 m)   Wt 140 lb 3.2 oz (63.6 kg)   LMP  (LMP Unknown)   SpO2 99%   BMI 26.49 kg/m   Assessment and Plan:  Problem List Items Addressed This Visit       Unprioritized   Type II diabetes mellitus with complication (HCC) -  Primary (Chronic)   Blood sugars have been stable.  No recent hypoglycemic events requiring assistance. Currently medications are Rybelsus  and MTF. Lab Results  Component Value Date   HGBA1C 6.0 (A) 04/25/2024   Last visit no changes were made.  Will continue the same medications.       Relevant Medications   lisinopril  (ZESTRIL ) 10 MG tablet   rosuvastatin  (CRESTOR ) 10 MG tablet   metFORMIN  (GLUCOPHAGE ) 1000 MG tablet   Semaglutide  (RYBELSUS ) 3 MG TABS   Other Relevant Orders   POCT glycosylated hemoglobin (Hb A1C) (Completed)   Hyperlipidemia associated with type 2 diabetes mellitus (HCC) (Chronic)   LDL is  Lab Results  Component Value Date   LDLCALC 81 11/09/2023   Current regimen is atorvastatin.  No medication side effects noted. Goal LDL is <70.       Relevant Medications   lisinopril  (ZESTRIL ) 10 MG tablet   rosuvastatin  (CRESTOR ) 10 MG tablet   metFORMIN  (GLUCOPHAGE ) 1000 MG tablet   Semaglutide  (RYBELSUS ) 3 MG TABS   Essential hypertension (Chronic)   Blood pressure is well controlled.  Current medications lisinopril . Will continue same regimen along with efforts to limit dietary sodium.       Relevant Medications   lisinopril  (ZESTRIL ) 10 MG tablet   rosuvastatin  (CRESTOR ) 10 MG tablet   Other Visit Diagnoses       Long term current use of oral hypoglycemic drug           Return in about 6 months (around 10/26/2024) for CPX.    Sheron Dixons, MD [facility] [place]

## 2024-04-25 NOTE — Progress Notes (Signed)
 04/25/2024 11:07 AM   Teresa Rivas 19-Nov-1966 [id number]  CC: Chief Complaint  Patient presents with   Follow-up   HPI: Teresa Rivas is a 58 y.o. female with PMH nephrolithiasis who presents today for annual follow-up.   Today she reports no flank pain or hematuria within the past year.  She has no acute concerns today.  KUB today with stable bilateral renal stones, no ureteral stones.  PMH: Past Medical History:  Diagnosis Date   Anemia    Diabetes mellitus without complication (HCC)    Headache    History of kidney stones    Hyperlipidemia    Hypertension    Kidney stones 04/27/2017    Surgical History: Past Surgical History:  Procedure Laterality Date   ABDOMINAL HYSTERECTOMY  07/28/2016   total due to fibroids   COLONOSCOPY WITH PROPOFOL  N/A 01/21/2021   Procedure: COLONOSCOPY WITH PROPOFOL ;  Surgeon: Shane Darling, MD;  Location: [facility];  Service: Endoscopy;  Laterality: N/A;   CYSTOSCOPY/URETEROSCOPY/HOLMIUM LASER/STENT PLACEMENT Right 04/27/2023   Procedure: CYSTOSCOPY/URETEROSCOPY/HOLMIUM LASER/STENT PLACEMENT;  Surgeon: Lawerence Pressman, MD;  Location: [facility];  Service: Urology;  Laterality: Right;   ESOPHAGOGASTRODUODENOSCOPY (EGD) WITH PROPOFOL  N/A 08/24/2023   Procedure: ESOPHAGOGASTRODUODENOSCOPY (EGD) WITH PROPOFOL ;  Surgeon: Shane Darling, MD;  Location: [facility];  Service: Endoscopy;  Laterality: N/A;   TONSILLECTOMY      Home Medications:  Allergies as of 04/25/2024       Reactions   Codeine         Medication List        Accurate as of Apr 25, 2024 11:07 AM. If you have any questions, ask your nurse or doctor.          glucose blood test strip 1 each by Other route as needed for other. Use as instructed   ipratropium 0.06 % nasal spray Commonly known as: ATROVENT  Place 2 sprays into both nostrils 4 (four) times daily. What changed:  when to take this reasons to take this    lisinopril  10 MG tablet Commonly known as: ZESTRIL  Take 1 tablet by mouth once daily   metFORMIN  1000 MG tablet Commonly known as: GLUCOPHAGE  TAKE 1 TABLET BY MOUTH TWICE DAILY WITH  A  MEAL   methocarbamol  500 MG tablet Commonly known as: Robaxin  Take 1 tablet (500 mg total) by mouth at bedtime. What changed:  when to take this reasons to take this   rosuvastatin  10 MG tablet Commonly known as: CRESTOR  Take 1 tablet by mouth once daily   Rybelsus  3 MG Tabs Generic drug: Semaglutide  Take 1 tablet (3 mg total) by mouth daily.        Allergies:  Allergies  Allergen Reactions   Codeine     Family History: Family History  Problem Relation Age of Onset   Diabetes Mother    Hypertension Mother    Breast cancer Mother 2   Breast cancer Maternal Grandmother    Breast cancer Cousin        pat cousin    Social History:   reports that she has never smoked. She has never used smokeless tobacco. She reports that she does not currently use alcohol after a past usage of about 1.0 standard drink of alcohol per week. She reports that she does not use drugs.  Physical Exam: BP 127/76   Pulse 77   Ht 5\' 1"  (1.549 m)   Wt 139 lb 12.8 oz (63.4 kg)   LMP  (LMP  Unknown)   BMI 26.41 kg/m   Constitutional:  Alert and oriented, no acute distress, nontoxic appearing HEENT: [place], AT Cardiovascular: No clubbing, cyanosis, or edema Respiratory: Normal respiratory effort, no increased work of breathing Skin: No rashes, bruises or suspicious lesions Neurologic: Grossly intact, no focal deficits, moving all 4 extremities Psychiatric: Normal mood and affect  Pertinent Imaging: KUB, 04/25/2024: See epic  I personally reviewed the images referenced above and note stable bilateral renal stones.  Assessment & Plan:   1. Kidney stones (Primary) No stone episodes within the past year.  Stone burden is stable on KUB today.  She prefers to keep annual follow-up, which is  reasonable.  Return in about 1 year (around 04/25/2025) for Annual stone visit with KUB prior.  Teresa Pare, PA-C  [facility] [place] [address], [address] [place], [place] [address] [phone]

## 2024-04-28 ENCOUNTER — Other Ambulatory Visit (HOSPITAL_COMMUNITY): Payer: Self-pay

## 2024-04-29 ENCOUNTER — Other Ambulatory Visit (HOSPITAL_COMMUNITY): Payer: Self-pay

## 2024-04-29 ENCOUNTER — Telehealth: Payer: Self-pay | Admitting: Pharmacy Technician

## 2024-04-29 NOTE — Telephone Encounter (Signed)
 Pharmacy Patient Advocate Encounter   Received notification from CoverMyMeds that prior authorization for Rybelsus  3MG  tablets is required/requested.   Insurance verification completed.   The patient is insured through [employer] .   Per test claim: PA required and submitted KEY/EOC/Request #: [id number] APPROVED from 03/30/24 to 04/29/25. Ran test claim, Copay is $14.99. This test claim was processed through [facility]- copay amounts may vary at other pharmacies due to pharmacy/plan contracts, or as the patient moves through the different stages of their insurance plan.

## 2024-05-16 ENCOUNTER — Ambulatory Visit
Admission: RE | Admit: 2024-05-16 | Discharge: 2024-05-16 | Disposition: A | Discharge status: Home / Self Care | Source: Ambulatory Visit | Attending: Emergency Medicine | Admitting: Emergency Medicine

## 2024-05-16 VITALS — BP 145/77 | HR 66 | Temp 98.8°F | Ht 61.0 in | Wt 137.0 lb

## 2024-05-16 DIAGNOSIS — H1031 Unspecified acute conjunctivitis, right eye: Secondary | ICD-10-CM | POA: Diagnosis not present

## 2024-05-16 MED ORDER — ERYTHROMYCIN 5 MG/GM OP OINT: TOPICAL_OINTMENT | OPHTHALMIC | 0 refills | Status: AC

## 2024-05-16 MED ORDER — TETRACAINE HCL 0.5 % OP SOLN
1.0000 [drp] | Freq: Once | OPHTHALMIC | Status: AC
  Administered 2024-05-16: 1 [drp] via OPHTHALMIC

## 2024-05-16 MED ORDER — FLUORESCEIN SODIUM 1 MG OP STRP
1.0000 | ORAL_STRIP | Freq: Once | OPHTHALMIC | Status: AC
  Administered 2024-05-16: 1 via OPHTHALMIC

## 2024-05-16 NOTE — ED Provider Notes (Signed)
 [facility]    CSN: [id number] Arrival date & time: 05/16/24  1016      History   Chief Complaint Chief Complaint  Patient presents with   Eye Problem    HPI Teresa Rivas is a 58 y.o. female.   58 year old female, Teresa Rivas, presents to urgent care for evaluation of right eye drainage/crusting and swelling that started yesterday. No known illness exposure, no trauma, no treatment tried.  Pt wears glasses,no contacts  The history is provided by the patient. No language interpreter was used.    Past Medical History:  Diagnosis Date   Anemia    Diabetes mellitus without complication (HCC)    Headache    History of kidney stones    Hyperlipidemia    Hypertension    Kidney stones 04/27/2017    Patient Active Problem List   Diagnosis Date Noted   Acute bacterial conjunctivitis of right eye 05/16/2024   Abdominal pain 08/30/2023   Hematuria 09/09/2021   S/P total hysterectomy and bilateral salpingo-oophorectomy 09/05/2020   Epicondylitis, lateral, right 09/26/2017   Type II diabetes mellitus with complication (HCC) 05/22/2017   Hyperlipidemia associated with type 2 diabetes mellitus (HCC) 05/22/2017   Essential hypertension 05/22/2017   Renal stones 05/22/2017    Past Surgical History:  Procedure Laterality Date   ABDOMINAL HYSTERECTOMY  07/28/2016   total due to fibroids   COLONOSCOPY WITH PROPOFOL  N/A 01/21/2021   Procedure: COLONOSCOPY WITH PROPOFOL ;  Surgeon: Shane Darling, MD;  Location: [facility];  Service: Endoscopy;  Laterality: N/A;   CYSTOSCOPY/URETEROSCOPY/HOLMIUM LASER/STENT PLACEMENT Right 04/27/2023   Procedure: CYSTOSCOPY/URETEROSCOPY/HOLMIUM LASER/STENT PLACEMENT;  Surgeon: Lawerence Pressman, MD;  Location: [facility];  Service: Urology;  Laterality: Right;   ESOPHAGOGASTRODUODENOSCOPY (EGD) WITH PROPOFOL  N/A 08/24/2023   Procedure: ESOPHAGOGASTRODUODENOSCOPY (EGD) WITH PROPOFOL ;  Surgeon: Shane Darling,  MD;  Location: [facility];  Service: Endoscopy;  Laterality: N/A;   TONSILLECTOMY      OB History   No obstetric history on file.      Home Medications    Prior to Admission medications   Medication Sig Start Date End Date Taking? Authorizing Provider  erythromycin ophthalmic ointment Place a 1/2 inch ribbon of ointment into the right lower eyelid every 6 hours x 5 days 05/16/24  Yes Nehan Flaum, Eveleen Hinds, NP  glucose blood test strip 1 each by Other route as needed for other. Use as instructed   Yes [provider]  ipratropium (ATROVENT ) 0.06 % nasal spray Place 2 sprays into both nostrils 4 (four) times daily. Patient taking differently: Place 2 sprays into both nostrils as needed. 04/09/23  Yes Kent Pear, NP  lisinopril  (ZESTRIL ) 10 MG tablet Take 1 tablet (10 mg total) by mouth daily. 04/25/24  Yes Sheron Dixons, MD  metFORMIN  (GLUCOPHAGE ) 1000 MG tablet Take 1 tablet (1,000 mg total) by mouth 2 (two) times daily with a meal. 04/25/24  Yes Sheron Dixons, MD  methocarbamol  (ROBAXIN ) 500 MG tablet Take 1 tablet (500 mg total) by mouth at bedtime. Patient taking differently: Take 500 mg by mouth as needed. 01/13/21  Yes Sheron Dixons, MD  rosuvastatin  (CRESTOR ) 10 MG tablet Take 1 tablet (10 mg total) by mouth daily. 04/25/24  Yes Sheron Dixons, MD  Semaglutide  (RYBELSUS ) 3 MG TABS Take 1 tablet (3 mg total) by mouth daily. 04/25/24  Yes Sheron Dixons, MD    Family History Family History  Problem Relation Age of Onset   Diabetes Mother  Hypertension Mother    Breast cancer Mother 39   Breast cancer Maternal Grandmother    Breast cancer Cousin        pat cousin    Social History Social History   Tobacco Use   Smoking status: Never   Smokeless tobacco: Never  Vaping Use   Vaping status: Never Used  Substance Use Topics   Alcohol use: Not Currently    Alcohol/week: 1.0 standard drink of alcohol    Types: 1 Standard drinks or equivalent per  week    Comment: occassionally   Drug use: No     Allergies   Codeine   Review of Systems Review of Systems  Constitutional:  Negative for fever.  Eyes:  Positive for photophobia, discharge and redness.  All other systems reviewed and are negative.    Physical Exam Triage Vital Signs ED Triage Vitals  Encounter Vitals Group     BP      Girls Systolic BP Percentile      Girls Diastolic BP Percentile      Boys Systolic BP Percentile      Boys Diastolic BP Percentile      Pulse      Resp      Temp      Temp src      SpO2      Weight      Height      Head Circumference      Peak Flow      Pain Score      Pain Loc      Pain Education      Exclude from Growth Chart    No data found.  Updated Vital Signs BP (!) 145/77 (BP Location: Left Arm)   Pulse 66   Temp 98.8 F (37.1 C) (Oral)   Ht 5' 1 (1.549 m)   Wt 137 lb (62.1 kg)   LMP  (LMP Unknown)   SpO2 100%   BMI 25.89 kg/m   Visual Acuity Right Eye Distance: 20/30 Left Eye Distance: 20/25 Bilateral Distance: 20/20  Right Eye Near:   Left Eye Near:    Bilateral Near:   (Pt wears prescription glasses)  Physical Exam Vitals and nursing note reviewed.  Constitutional:      Appearance: She is well-developed and well-groomed.  HENT:     Head: Normocephalic.   Eyes:     General: Lids are normal. Vision grossly intact.     Extraocular Movements: Extraocular movements intact.     Conjunctiva/sclera:     Right eye: Right conjunctiva is injected.     Pupils: Pupils are equal, round, and reactive to light.     Right eye: No fluorescein uptake.     Comments: OD 20/30(affected) OS 20/25 OU 20/200 with glassses    Cardiovascular:     Rate and Rhythm: Normal rate.  Pulmonary:     Effort: Pulmonary effort is normal.   Skin:    General: Skin is warm.   Neurological:     General: No focal deficit present.     Mental Status: She is alert and oriented to person, place, and time.     GCS: GCS eye  subscore is 4. GCS verbal subscore is 5. GCS motor subscore is 6.   Psychiatric:        Behavior: Behavior is cooperative.      UC Treatments / Results  Labs (all labs ordered are listed, but only abnormal results are displayed) Labs Reviewed -  No data to display  EKG   Radiology No results found.  Procedures Procedures (including critical care time)  Medications Ordered in UC Medications  tetracaine (PONTOCAINE) 0.5 % ophthalmic solution 1 drop (1 drop Right Eye Given by Other 05/16/24 1029)  fluorescein ophthalmic strip 1 strip (1 strip Right Eye Given by Other 05/16/24 1029)    Initial Impression / Assessment and Plan / UC Course  I have reviewed the triage vital signs and the nursing notes.  Pertinent labs & imaging results that were available during my care of the patient were reviewed by me and considered in my medical decision making (see chart for details).    Discussed exam findings and plan of care with patient, erythromycin ointment scripted, patient does not wear contact lenses , wears glasses ;strict go to ER precautions given.  Patient verbalized understanding to this provider.  Ddx: Bacterial conjunctivitis right eye, corneal abrasion, allergies, insect bite Final Clinical Impressions(s) / UC Diagnoses   Final diagnoses:  Acute bacterial conjunctivitis of right eye     Discharge Instructions      May use warm moist cotton balls wiping inner to outer eye to remove drainage from eyes,throw away,repeat. Wash hands before and after touching face,using eye medication. Do not rub eyes. Use eye medication as prescribed(erythromycin). Follow up with PCP/eye doctor if no improvement or worsening of symptoms-call for appointment.  Go to ER if you have vision changes or worsening symptoms.       ED Prescriptions     Medication Sig Dispense Auth. Provider   erythromycin ophthalmic ointment Place a 1/2 inch ribbon of ointment into the right lower eyelid every 6  hours x 5 days 3.5 g Rileyann Florance, Eveleen Hinds, NP      PDMP not reviewed this encounter.   Peter Brands, NP 05/16/24 1224

## 2024-05-16 NOTE — Discharge Instructions (Addendum)
 May use warm moist cotton balls wiping inner to outer eye to remove drainage from eyes,throw away,repeat. Wash hands before and after touching face,using eye medication. Do not rub eyes. Use eye medication as prescribed(erythromycin). Follow up with PCP/eye doctor if no improvement or worsening of symptoms-call for appointment.  Go to ER if you have vision changes or worsening symptoms.

## 2024-05-16 NOTE — ED Triage Notes (Addendum)
 Pt c/o right eye swelling x2days  Pt states that she had eyelid swelling and soreness starting on 05/15/24.  Pt states that she had eye drainage this morning upon waking.   Pt states that there is  a film over my eye when looking straight ahead, but it goes away if she is looking down.

## 2024-07-23 DIAGNOSIS — E119 Type 2 diabetes mellitus without complications: Secondary | ICD-10-CM | POA: Diagnosis not present

## 2024-07-23 LAB — HM DIABETES EYE EXAM

## 2024-08-21 DIAGNOSIS — J069 Acute upper respiratory infection, unspecified: Secondary | ICD-10-CM | POA: Diagnosis not present

## 2024-10-27 ENCOUNTER — Ambulatory Visit: Admitting: Internal Medicine

## 2024-10-27 ENCOUNTER — Encounter: Payer: Self-pay | Admitting: Internal Medicine

## 2024-10-27 VITALS — BP 122/78 | HR 87 | Ht 61.0 in | Wt 132.0 lb

## 2024-10-27 DIAGNOSIS — Z23 Encounter for immunization: Secondary | ICD-10-CM | POA: Diagnosis not present

## 2024-10-27 DIAGNOSIS — E118 Type 2 diabetes mellitus with unspecified complications: Secondary | ICD-10-CM | POA: Diagnosis not present

## 2024-10-27 DIAGNOSIS — I10 Essential (primary) hypertension: Secondary | ICD-10-CM | POA: Diagnosis not present

## 2024-10-27 DIAGNOSIS — Z7984 Long term (current) use of oral hypoglycemic drugs: Secondary | ICD-10-CM

## 2024-10-27 DIAGNOSIS — Z1231 Encounter for screening mammogram for malignant neoplasm of breast: Secondary | ICD-10-CM

## 2024-10-27 DIAGNOSIS — E1169 Type 2 diabetes mellitus with other specified complication: Secondary | ICD-10-CM

## 2024-10-27 DIAGNOSIS — E785 Hyperlipidemia, unspecified: Secondary | ICD-10-CM

## 2024-10-27 MED ORDER — ROSUVASTATIN CALCIUM 10 MG PO TABS: 10.0000 mg | ORAL_TABLET | Freq: Every day | ORAL | 5 refills | Status: AC

## 2024-10-27 MED ORDER — RYBELSUS 3 MG PO TABS: 1.0000 | ORAL_TABLET | Freq: Every day | ORAL | 5 refills | Status: AC

## 2024-10-27 NOTE — Assessment & Plan Note (Signed)
 LDL is  Lab Results  Component Value Date   LDLCALC 81 11/09/2023    Current medication regimen is Crestor . Goal LDL is < 70.

## 2024-10-27 NOTE — Assessment & Plan Note (Signed)
 Well controlled blood pressure today. Current regimen is lisinopril . No medication side effects noted.

## 2024-10-27 NOTE — Assessment & Plan Note (Addendum)
 Currently medications are Rybelsus  and MTF.  No hypoglycemic episodes noted. Last visit medical regimen changes were none. Lab Results  Component Value Date   HGBA1C 6.0 (A) 04/25/2024  Due for labs and U/Cr today

## 2024-10-27 NOTE — Progress Notes (Signed)
 Date:  10/27/2024   Name:  Teresa Rivas   DOB:  June 22, 1966   MRN:  [id number]   Chief Complaint: Diabetes  Diabetes She presents for her follow-up diabetic visit. She has type 2 diabetes mellitus. Pertinent negatives for hypoglycemia include no dizziness or headaches. Pertinent negatives for diabetes include no chest pain, no fatigue and no weakness.  Hyperlipidemia This is a chronic problem. The problem is controlled. Pertinent negatives include no chest pain, myalgias or shortness of breath. Current antihyperlipidemic treatment includes statins. The current treatment provides significant improvement of lipids.  Hypertension This is a chronic problem. The problem is controlled. Pertinent negatives include no chest pain, headaches, palpitations or shortness of breath. Past treatments include ACE inhibitors.    Review of Systems  Constitutional:  Negative for fatigue and unexpected weight change.  HENT:  Negative for trouble swallowing.   Eyes:  Negative for visual disturbance.  Respiratory:  Negative for cough, chest tightness, shortness of breath and wheezing.   Cardiovascular:  Negative for chest pain, palpitations and leg swelling.  Gastrointestinal:  Negative for abdominal pain, constipation and diarrhea.  Musculoskeletal:  Negative for arthralgias and myalgias.  Neurological:  Negative for dizziness, weakness, light-headedness and headaches.     Lab Results  Component Value Date   NA 139 11/09/2023   K 4.0 11/09/2023   CO2 23 11/09/2023   GLUCOSE 173 (H) 11/09/2023   BUN 13 11/09/2023   CREATININE 0.87 11/09/2023   CALCIUM  9.9 11/09/2023   EGFR 78 11/09/2023   GFRNONAA >60 04/10/2023   Lab Results  Component Value Date   CHOL 167 11/09/2023   HDL 76 11/09/2023   LDLCALC 81 11/09/2023   TRIG 45 11/09/2023   CHOLHDL 2.2 11/09/2023   Lab Results  Component Value Date   TSH 1.480 11/09/2023   Lab Results  Component Value Date   HGBA1C 6.0 (A)  04/25/2024   Lab Results  Component Value Date   WBC 6.0 11/09/2023   HGB 11.7 11/09/2023   HCT 38.3 11/09/2023   MCV 84 11/09/2023   PLT 212 11/09/2023   Lab Results  Component Value Date   ALT 17 11/09/2023   AST 24 11/09/2023   ALKPHOS 77 11/09/2023   BILITOT 0.3 11/09/2023   Lab Results  Component Value Date   VD25OH 22.49 (L) 09/06/2020     Patient Active Problem List   Diagnosis Date Noted   Acute bacterial conjunctivitis of right eye 05/16/2024   Abdominal pain 08/30/2023   Hematuria 09/09/2021   S/P total hysterectomy and bilateral salpingo-oophorectomy 09/05/2020   Epicondylitis, lateral, right 09/26/2017   Type II diabetes mellitus with complication (HCC) 05/22/2017   Hyperlipidemia associated with type 2 diabetes mellitus (HCC) 05/22/2017   Essential hypertension 05/22/2017   Renal stones 05/22/2017    Allergies  Allergen Reactions   Codeine     Past Surgical History:  Procedure Laterality Date   ABDOMINAL HYSTERECTOMY  07/28/2016   total due to fibroids   COLONOSCOPY WITH PROPOFOL  N/A 01/21/2021   Procedure: COLONOSCOPY WITH PROPOFOL ;  Surgeon: Maryruth Ole DASEN, MD;  Location: [facility] ENDOSCOPY;  Service: Endoscopy;  Laterality: N/A;   CYSTOSCOPY/URETEROSCOPY/HOLMIUM LASER/STENT PLACEMENT Right 04/27/2023   Procedure: CYSTOSCOPY/URETEROSCOPY/HOLMIUM LASER/STENT PLACEMENT;  Surgeon: Francisca Redell BROCKS, MD;  Location: [facility] ORS;  Service: Urology;  Laterality: Right;   ESOPHAGOGASTRODUODENOSCOPY (EGD) WITH PROPOFOL  N/A 08/24/2023   Procedure: ESOPHAGOGASTRODUODENOSCOPY (EGD) WITH PROPOFOL ;  Surgeon: Maryruth Ole DASEN, MD;  Location: [facility] ENDOSCOPY;  Service: Endoscopy;  Laterality: N/A;   TONSILLECTOMY      Social History   Tobacco Use   Smoking status: Never   Smokeless tobacco: Never  Vaping Use   Vaping status: Never Used  Substance Use Topics   Alcohol use: Not Currently    Alcohol/week: 1.0 standard drink of alcohol    Types: 1 Standard drinks  or equivalent per week    Comment: occassionally   Drug use: No     Medication list has been reviewed and updated.  Current Meds  Medication Sig   erythromycin  ophthalmic ointment Place a 1/2 inch ribbon of ointment into the right lower eyelid every 6 hours x 5 days   glucose blood test strip 1 each by Other route as needed for other. Use as instructed   ipratropium (ATROVENT ) 0.06 % nasal spray Place 2 sprays into both nostrils 4 (four) times daily. (Patient taking differently: Place 2 sprays into both nostrils as needed.)   lisinopril  (ZESTRIL ) 10 MG tablet Take 1 tablet (10 mg total) by mouth daily.   metFORMIN  (GLUCOPHAGE ) 1000 MG tablet Take 1 tablet (1,000 mg total) by mouth 2 (two) times daily with a meal.   methocarbamol  (ROBAXIN ) 500 MG tablet Take 1 tablet (500 mg total) by mouth at bedtime. (Patient taking differently: Take 500 mg by mouth as needed.)   [DISCONTINUED] rosuvastatin  (CRESTOR ) 10 MG tablet Take 1 tablet (10 mg total) by mouth daily.   [DISCONTINUED] Semaglutide  (RYBELSUS ) 3 MG TABS Take 1 tablet (3 mg total) by mouth daily.       10/27/2024   10:41 AM 10/23/2023    9:00 AM 08/30/2023   11:01 AM 04/17/2023    9:05 AM  GAD 7 : Generalized Anxiety Score  Nervous, Anxious, on Edge 0 1 0 0  Control/stop worrying 0 1 0 0  Worry too much - different things 0 1 0 0  Trouble relaxing 0 0 0 0  Restless 0 0 0 0  Easily annoyed or irritable 0 0 0 0  Afraid - awful might happen 0 0 0 0  Total GAD 7 Score 0 3 0 0  Anxiety Difficulty Not difficult at all Not difficult at all Not difficult at all Not difficult at all       10/27/2024   10:41 AM 10/23/2023    8:59 AM 08/30/2023   11:01 AM  Depression screen PHQ 2/9  Decreased Interest 0 3 0  Down, Depressed, Hopeless 0 3 0  PHQ - 2 Score 0 6 0  Altered sleeping 0 0 0  Tired, decreased energy 0 0 1  Change in appetite 0 0 0  Feeling bad or failure about yourself  0 0 0  Trouble concentrating 0 0 0  Moving slowly  or fidgety/restless 0 0 0  Suicidal thoughts 0 0 0  PHQ-9 Score 0 6  1   Difficult doing work/chores Not difficult at all Not difficult at all Not difficult at all     Data saved with a previous flowsheet row definition    BP Readings from Last 3 Encounters:  10/27/24 122/78  05/16/24 (!) 145/77  04/25/24 112/66    Physical Exam Vitals and nursing note reviewed.  Constitutional:      General: She is not in acute distress.    Appearance: Normal appearance. She is well-developed.  HENT:     Head: Normocephalic and atraumatic.  Neck:     Vascular: No carotid bruit.  Cardiovascular:     Rate and  Rhythm: Normal rate and regular rhythm.     Heart sounds: No murmur heard. Pulmonary:     Effort: Pulmonary effort is normal. No respiratory distress.     Breath sounds: No wheezing or rhonchi.  Abdominal:     General: Abdomen is flat.     Palpations: Abdomen is soft.     Tenderness: There is no abdominal tenderness.  Musculoskeletal:     Cervical back: Normal range of motion.     Right lower leg: No edema.     Left lower leg: No edema.  Lymphadenopathy:     Cervical: No cervical adenopathy.  Skin:    General: Skin is warm and dry.     Capillary Refill: Capillary refill takes less than 2 seconds.     Findings: No rash.  Neurological:     General: No focal deficit present.     Mental Status: She is alert and oriented to person, place, and time.  Psychiatric:        Mood and Affect: Mood normal.        Behavior: Behavior normal.    Diabetic Foot Exam - Simple   Simple Foot Form Diabetic Foot exam was performed with the following findings: Yes 10/27/2024 11:00 AM  Visual Inspection No deformities, no ulcerations, no other skin breakdown bilaterally: Yes Sensation Testing Intact to touch and monofilament testing bilaterally: Yes Pulse Check Posterior Tibialis and Dorsalis pulse intact bilaterally: Yes Comments      Wt Readings from Last 3 Encounters:  10/27/24 132 lb  (59.9 kg)  05/16/24 137 lb (62.1 kg)  04/25/24 140 lb 3.2 oz (63.6 kg)    BP 122/78   Pulse 87   Ht 5' 1 (1.549 m)   Wt 132 lb (59.9 kg)   LMP  (LMP Unknown)   SpO2 99%   BMI 24.94 kg/m   Assessment and Plan:  Problem List Items Addressed This Visit       Unprioritized   Type II diabetes mellitus with complication (HCC) (Chronic)   Currently medications are Rybelsus  and MTF.  No hypoglycemic episodes noted. Last visit medical regimen changes were none. Lab Results  Component Value Date   HGBA1C 6.0 (A) 04/25/2024  Due for labs and U/Cr today       Relevant Medications   rosuvastatin  (CRESTOR ) 10 MG tablet   Semaglutide  (RYBELSUS ) 3 MG TABS   Other Relevant Orders   Comprehensive metabolic panel with GFR   Hemoglobin A1c   Microalbumin / creatinine urine ratio   Hyperlipidemia associated with type 2 diabetes mellitus (HCC) (Chronic)   LDL is  Lab Results  Component Value Date   LDLCALC 81 11/09/2023    Current medication regimen is Crestor . Goal LDL is < 70.       Relevant Medications   rosuvastatin  (CRESTOR ) 10 MG tablet   Semaglutide  (RYBELSUS ) 3 MG TABS   Other Relevant Orders   Lipid panel   Essential hypertension - Primary (Chronic)   Well controlled blood pressure today. Current regimen is lisinopril . No medication side effects noted.        Relevant Medications   rosuvastatin  (CRESTOR ) 10 MG tablet   Other Relevant Orders   CBC with Differential/Platelet   Comprehensive metabolic panel with GFR   TSH   Other Visit Diagnoses       Long term current use of oral hypoglycemic drug         Encounter for screening mammogram for breast cancer  Relevant Orders   MM 3D SCREENING MAMMOGRAM BILATERAL BREAST     Encounter for immunization       Relevant Orders   Flu vaccine trivalent PF, 6mos and older(Flulaval,Afluria,Fluarix,Fluzone) (Completed)   Varicella-zoster vaccine IM (Completed)       Return in about 4 months (around  02/25/2025) for DM  Dr. Lemon.    Leita HILARIO Adie, MD [facility] [place]

## 2024-10-27 NOTE — Patient Instructions (Signed)
 Call [facility] to schedule your mammogram at [phone].

## 2024-11-17 ENCOUNTER — Encounter: Payer: Self-pay | Admitting: Internal Medicine

## 2024-11-18 NOTE — Telephone Encounter (Signed)
 Please advise.   JM

## 2024-11-24 ENCOUNTER — Other Ambulatory Visit
Admission: RE | Admit: 2024-11-24 | Discharge: 2024-11-24 | Disposition: A | Source: Home / Self Care | Attending: Internal Medicine | Admitting: Internal Medicine

## 2024-11-24 DIAGNOSIS — E785 Hyperlipidemia, unspecified: Secondary | ICD-10-CM | POA: Diagnosis not present

## 2024-11-24 DIAGNOSIS — E118 Type 2 diabetes mellitus with unspecified complications: Secondary | ICD-10-CM | POA: Insufficient documentation

## 2024-11-24 DIAGNOSIS — E1169 Type 2 diabetes mellitus with other specified complication: Secondary | ICD-10-CM | POA: Insufficient documentation

## 2024-11-24 DIAGNOSIS — I10 Essential (primary) hypertension: Secondary | ICD-10-CM | POA: Insufficient documentation

## 2024-11-24 LAB — COMPREHENSIVE METABOLIC PANEL WITH GFR
ALT: 20 U/L (ref 0–44)
AST: 26 U/L (ref 15–41)
Albumin: 4.5 g/dL (ref 3.5–5.0)
Alkaline Phosphatase: 72 U/L (ref 38–126)
Anion gap: 10 (ref 5–15)
BUN: 14 mg/dL (ref 6–20)
CO2: 27 mmol/L (ref 22–32)
Calcium: 9.8 mg/dL (ref 8.9–10.3)
Chloride: 105 mmol/L (ref 98–111)
Creatinine, Ser: 0.75 mg/dL (ref 0.44–1.00)
GFR, Estimated: >60 mL/min
Glucose, Bld: 110 mg/dL — ABNORMAL HIGH (ref 70–99)
Potassium: 4 mmol/L (ref 3.5–5.1)
Sodium: 142 mmol/L (ref 135–145)
Total Bilirubin: 0.3 mg/dL (ref 0.0–1.2)
Total Protein: 7 g/dL (ref 6.5–8.1)

## 2024-11-24 LAB — HEMOGLOBIN A1C
Hgb A1c MFr Bld: 6.5 % — ABNORMAL HIGH (ref 4.8–5.6)
Mean Plasma Glucose: 139.85 mg/dL

## 2024-11-24 LAB — CBC WITH DIFFERENTIAL/PLATELET
Abs Immature Granulocytes: 0.01 K/uL (ref 0.00–0.07)
Basophils Absolute: 0 K/uL (ref 0.0–0.1)
Basophils Relative: 1 %
Eosinophils Absolute: 0.1 K/uL (ref 0.0–0.5)
Eosinophils Relative: 2 %
HCT: 39.9 % (ref 36.0–46.0)
Hemoglobin: 12.2 g/dL (ref 12.0–15.0)
Immature Granulocytes: 0 %
Lymphocytes Relative: 38 %
Lymphs Abs: 2.2 K/uL (ref 0.7–4.0)
MCH: 25.9 pg — ABNORMAL LOW (ref 26.0–34.0)
MCHC: 30.6 g/dL (ref 30.0–36.0)
MCV: 84.7 fL (ref 80.0–100.0)
Monocytes Absolute: 0.3 K/uL (ref 0.1–1.0)
Monocytes Relative: 4 %
Neutro Abs: 3.2 K/uL (ref 1.7–7.7)
Neutrophils Relative %: 55 %
Platelets: 211 K/uL (ref 150–400)
RBC: 4.71 MIL/uL (ref 3.87–5.11)
RDW: 12.8 % (ref 11.5–15.5)
WBC: 5.8 K/uL (ref 4.0–10.5)
nRBC: 0 % (ref 0.0–0.2)

## 2024-11-24 LAB — LIPID PANEL
Cholesterol: 174 mg/dL (ref 0–200)
HDL: 88 mg/dL
LDL Cholesterol: 79 mg/dL (ref 0–99)
Total CHOL/HDL Ratio: 2 ratio
Triglycerides: 37 mg/dL
VLDL: 7 mg/dL (ref 0–40)

## 2024-11-24 LAB — TSH: TSH: 1.51 u[IU]/mL (ref 0.350–4.500)

## 2024-11-25 ENCOUNTER — Ambulatory Visit: Payer: Self-pay | Admitting: Internal Medicine

## 2024-11-25 LAB — MICROALBUMIN / CREATININE URINE RATIO
Creatinine, Urine: 164.3 mg/dL
Microalb Creat Ratio: 7 mg/g{creat} (ref 0–29)
Microalb, Ur: 12.3 ug/mL — ABNORMAL HIGH

## 2024-12-12 NOTE — Progress Notes (Signed)
 Teresa Rivas                                          MRN: [id number]   12/12/2024   The [facility] Quality Team Specialist reviewed this patient medical record for the purposes of chart review for care gap closure. The following were reviewed: abstraction for care gap closure-glycemic status assessment.    [facility] Quality Team

## 2025-03-02 ENCOUNTER — Ambulatory Visit: Admitting: Student

## 2025-04-24 ENCOUNTER — Ambulatory Visit: Admitting: Physician Assistant
